# Patient Record
Sex: Male | Born: 1970 | Hispanic: No | Marital: Married
Health system: Southern US, Community
[De-identification: ages and names within clinical notes are randomized; demographics above are authoritative.]

## PROBLEM LIST (undated history)

## (undated) DIAGNOSIS — R251 Tremor, unspecified: Secondary | ICD-10-CM

## (undated) DIAGNOSIS — M542 Cervicalgia: Secondary | ICD-10-CM

## (undated) HISTORY — DX: Cervicalgia: M54.2

## (undated) HISTORY — PX: SHOULDER SURGERY: SHX246

## (undated) HISTORY — DX: Tremor, unspecified: R25.1

---

## 2015-09-20 ENCOUNTER — Encounter (HOSPITAL_COMMUNITY): Payer: Self-pay | Admitting: *Deleted

## 2015-09-20 ENCOUNTER — Emergency Department (INDEPENDENT_AMBULATORY_CARE_PROVIDER_SITE_OTHER)
Admission: EM | Admit: 2015-09-20 | Discharge: 2015-09-20 | Disposition: A | Discharge status: Home / Self Care | Payer: Medicaid Other | Source: Home / Self Care | Attending: Family Medicine | Admitting: Family Medicine

## 2015-09-20 DIAGNOSIS — M5442 Lumbago with sciatica, left side: Secondary | ICD-10-CM

## 2015-09-20 MED ORDER — METHYLPREDNISOLONE 4 MG PO TBPK: ORAL_TABLET | ORAL | Status: DC

## 2015-09-20 MED ORDER — CYCLOBENZAPRINE HCL 5 MG PO TABS: 5.0000 mg | ORAL_TABLET | Freq: Three times a day (TID) | ORAL | Status: DC | PRN

## 2015-09-20 NOTE — ED Provider Notes (Signed)
CSN: [id number]     Arrival date & time 09/20/15  1655 History   First MD Initiated Contact with Patient 09/20/15 1840     Chief Complaint  Patient presents with  . Back Pain   (Consider location/radiation/quality/duration/timing/severity/associated sxs/prior Treatment) Patient is a 45 y.o. male presenting with back pain. The history is provided by the patient. The history is limited by a language barrier. A language interpreter was used.  Back Pain Location:  Sacro-iliac joint and lumbar spine Quality:  Stiffness Radiates to:  L posterior upper leg Pain severity:  Moderate Onset quality:  Gradual Duration:  2 days Progression:  Waxing and waning Chronicity:  Chronic Associated symptoms: leg pain, numbness and paresthesias   Associated symptoms: no abdominal pain, no bladder incontinence, no dysuria, no fever, no pelvic pain, no perianal numbness and no weakness     History reviewed. No pertinent past medical history. History reviewed. No pertinent past surgical history. No family history on file. Social History  Substance Use Topics  . Smoking status: None  . Smokeless tobacco: None  . Alcohol Use: Yes    Review of Systems  Constitutional: Negative.  Negative for fever.  Cardiovascular: Negative.   Gastrointestinal: Negative.  Negative for abdominal pain.  Genitourinary: Negative.  Negative for bladder incontinence, dysuria and pelvic pain.  Musculoskeletal: Positive for back pain. Negative for joint swelling and gait problem.  Neurological: Positive for numbness and paresthesias. Negative for weakness.  All other systems reviewed and are negative.   Allergies  Review of patient's allergies indicates no known allergies.  Home Medications   Prior to Admission medications   Medication Sig Start Date End Date Taking? Authorizing Provider  cyclobenzaprine (FLEXERIL) 5 MG tablet Take 1 tablet (5 mg total) by mouth 3 (three) times daily as needed for muscle spasms. 09/20/15    Linna Hoff, MD  methylPREDNISolone (MEDROL DOSEPAK) 4 MG TBPK tablet follow package directions , start on tues. Take until finished. 09/20/15   Linna Hoff, MD   Meds Ordered and Administered this Visit  Medications - No data to display  BP 135/75 mmHg  Pulse 77  Temp(Src) 98.2 F (36.8 C) (Oral)  Resp 16  SpO2 98% No data found.   Physical Exam  Constitutional: He is oriented to person, place, and time. He appears well-developed and well-nourished. No distress.  Abdominal: Soft. Bowel sounds are normal.  Musculoskeletal: He exhibits tenderness.       Lumbar back: He exhibits decreased range of motion, tenderness, bony tenderness, pain and spasm. He exhibits no swelling, no deformity and normal pulse.       Back:  Neurological: He is alert and oriented to person, place, and time.  Skin: Skin is warm and dry.  Nursing note and vitals reviewed.   ED Course  Procedures (including critical care time)  Labs Review Labs Reviewed - No data to display  Imaging Review No results found.   Visual Acuity Review  Right Eye Distance:   Left Eye Distance:   Bilateral Distance:    Right Eye Near:   Left Eye Near:    Bilateral Near:         MDM   1. Left-sided low back pain with left-sided sciatica    Meds ordered this encounter  Medications  . cyclobenzaprine (FLEXERIL) 5 MG tablet    Sig: Take 1 tablet (5 mg total) by mouth 3 (three) times daily as needed for muscle spasms.    Dispense:  30 tablet  Refill:  0  . methylPREDNISolone (MEDROL DOSEPAK) 4 MG TBPK tablet    Sig: follow package directions , start on tues. Take until finished.    Dispense:  21 tablet    Refill:  0       Linna Hoff, MD 09/20/15 [phone]

## 2015-09-20 NOTE — ED Notes (Addendum)
Pt  Reports   Back  Pain        For  A  Long  Time     denys   Any        Recent  Injury        Has had  Problems  In  Past           Pt      Has  Been  In  [place]  For  5  Months      Pt  Is  Sitting  Upright  On the  Exam table  Speaking in   Complete  sentances      [employer]  Interpretors  utilized

## 2015-10-13 ENCOUNTER — Other Ambulatory Visit: Payer: Self-pay

## 2015-10-13 ENCOUNTER — Other Ambulatory Visit: Payer: Self-pay | Admitting: Nurse Practitioner

## 2015-10-13 ENCOUNTER — Encounter (HOSPITAL_COMMUNITY): Payer: Self-pay | Admitting: Vascular Surgery

## 2015-10-13 ENCOUNTER — Emergency Department (INDEPENDENT_AMBULATORY_CARE_PROVIDER_SITE_OTHER)
Admission: EM | Admit: 2015-10-13 | Discharge: 2015-10-13 | Disposition: A | Discharge status: Nursing Home (Includes ICF) | Payer: Medicaid Other | Source: Home / Self Care | Attending: Family Medicine | Admitting: Family Medicine

## 2015-10-13 ENCOUNTER — Emergency Department (HOSPITAL_COMMUNITY): Payer: Medicaid Other

## 2015-10-13 ENCOUNTER — Emergency Department (HOSPITAL_COMMUNITY)
Admission: EM | Admit: 2015-10-13 | Discharge: 2015-10-13 | Disposition: A | Discharge status: Home / Self Care | Payer: Medicaid Other | Attending: Emergency Medicine | Admitting: Emergency Medicine

## 2015-10-13 DIAGNOSIS — R079 Chest pain, unspecified: Secondary | ICD-10-CM | POA: Diagnosis present

## 2015-10-13 DIAGNOSIS — F17210 Nicotine dependence, cigarettes, uncomplicated: Secondary | ICD-10-CM | POA: Diagnosis not present

## 2015-10-13 DIAGNOSIS — R072 Precordial pain: Secondary | ICD-10-CM

## 2015-10-13 LAB — BASIC METABOLIC PANEL
Anion gap: 10 (ref 5–15)
BUN: 13 mg/dL (ref 6–20)
CO2: 24 mmol/L (ref 22–32)
Calcium: 9.3 mg/dL (ref 8.9–10.3)
Chloride: 107 mmol/L (ref 101–111)
Creatinine, Ser: 0.79 mg/dL (ref 0.61–1.24)
GFR calc Af Amer: >60 mL/min (ref >60)
GFR calc non Af Amer: >60 mL/min (ref >60)
Glucose, Bld: 116 mg/dL — ABNORMAL HIGH (ref 65–99)
Potassium: 3.4 mmol/L — ABNORMAL LOW (ref 3.5–5.1)
Sodium: 141 mmol/L (ref 135–145)

## 2015-10-13 LAB — CBC
HCT: 43.4 % (ref 39.0–52.0)
Hemoglobin: 14.9 g/dL (ref 13.0–17.0)
MCH: 30.3 pg (ref 26.0–34.0)
MCHC: 34.3 g/dL (ref 30.0–36.0)
MCV: 88.2 fL (ref 78.0–100.0)
Platelets: 254 10*3/uL (ref 150–400)
RBC: 4.92 MIL/uL (ref 4.22–5.81)
RDW: 12.3 % (ref 11.5–15.5)
WBC: 9.5 10*3/uL (ref 4.0–10.5)

## 2015-10-13 LAB — I-STAT TROPONIN, ED
Troponin i, poc: 0 ng/mL (ref 0.00–0.08)
Troponin i, poc: 0 ng/mL (ref 0.00–0.08)

## 2015-10-13 MED ORDER — NITROGLYCERIN 0.4 MG SL SUBL
0.4000 mg | SUBLINGUAL_TABLET | SUBLINGUAL | Status: DC | PRN
  Administered 2015-10-13: 0.4 mg via SUBLINGUAL

## 2015-10-13 MED ORDER — ASPIRIN 81 MG PO CHEW
CHEWABLE_TABLET | ORAL | Status: AC
  Filled 2015-10-13: qty 4

## 2015-10-13 MED ORDER — NITROGLYCERIN 0.4 MG SL SUBL
SUBLINGUAL_TABLET | SUBLINGUAL | Status: AC
  Filled 2015-10-13: qty 1

## 2015-10-13 MED ORDER — ASPIRIN 81 MG PO CHEW
324.0000 mg | CHEWABLE_TABLET | Freq: Once | ORAL | Status: AC
Start: 2015-10-13 — End: 2015-10-13
  Administered 2015-10-13: 324 mg via ORAL

## 2015-10-13 NOTE — ED Notes (Signed)
Pt reports to the ED for eval of left sided CP that radiates into his left arm and elbow. Reports associated symptoms of SOB. Pain began around 7 am today while at work. Pain is worse with leaning forward. Received 1 nitro and 324 of ASA and now pain is much better. Only a 1/10. No significant medical hx. 12 lead unremarkable. Pt A&OX4, resp e/u, and skin warm and dry. Speaks Arabic, interpretor phone used for assessment.

## 2015-10-13 NOTE — ED Provider Notes (Signed)
CSN: [id number]     Arrival date & time 10/13/15  1442 History   First MD Initiated Contact with Patient 10/13/15 1505     Chief Complaint  Patient presents with  . Chest Pain     (Consider location/radiation/quality/duration/timing/severity/associated sxs/prior Treatment) Patient is a 45 y.o. male presenting with chest pain. The history is provided by the patient and medical records. A language interpreter was used.  Chest Pain Associated symptoms: no abdominal pain, no back pain, no cough, no dizziness, no fever, no headache, no nausea, no palpitations, no shortness of breath, not vomiting and no weakness    Arthur James is a 45 y.o. male  with no pertinent PMH who was sent to the emergency department from urgent care for central, left-sided chest pain described as sharp and tight which radiated down the left shoulder and arm. Onset 7 AM. Associated symptoms include shortness of breath. At urgent care patient was given 324 ASA and nitroglycerin. Patient states improvement after medications were given. At the time of my initial evaluation, chest pain had resolved. No cardiac history. No cardiac family history. Every day smoker. Denies drug use.   History reviewed. No pertinent past medical history. History reviewed. No pertinent past surgical history. No family history on file. Social History  Substance Use Topics  . Smoking status: Current Every Day Smoker -- 1.00 packs/day    Types: Cigarettes  . Smokeless tobacco: Never Used  . Alcohol Use: No    Review of Systems  Constitutional: Negative for fever and chills.  HENT: Negative for congestion and sore throat.   Eyes: Negative for visual disturbance.  Respiratory: Negative for cough, shortness of breath and wheezing.   Cardiovascular: Positive for chest pain. Negative for palpitations.  Gastrointestinal: Negative for nausea, vomiting and abdominal pain.  Genitourinary: Negative for dysuria.  Musculoskeletal: Negative for back  pain and neck pain.  Skin: Negative for color change.  Neurological: Negative for dizziness, weakness and headaches.      Allergies  Review of patient's allergies indicates no known allergies.  Home Medications   Prior to Admission medications   Medication Sig Start Date End Date Taking? Authorizing Provider  cyclobenzaprine (FLEXERIL) 5 MG tablet Take 1 tablet (5 mg total) by mouth 3 (three) times daily as needed for muscle spasms. Patient not taking: Reported on 10/13/2015 09/20/15   Linna HoffJames D Kindl, MD  methylPREDNISolone (MEDROL DOSEPAK) 4 MG TBPK tablet follow package directions , start on tues. Take until finished. Patient not taking: Reported on 10/13/2015 09/20/15   Linna HoffJames D Kindl, MD   BP 97/69 mmHg  Pulse 70  Temp(Src) 98.4 F (36.9 C) (Oral)  Resp 21  SpO2 96% Physical Exam  Constitutional: He is oriented to person, place, and time. He appears well-developed and well-nourished.  Alert and in no acute distress  HENT:  Head: Normocephalic and atraumatic.  Neck:  No carotid bruits.  Cardiovascular: Normal rate, regular rhythm, normal heart sounds and intact distal pulses.  Exam reveals no gallop and no friction rub.   No murmur heard. Pulmonary/Chest: Effort normal and breath sounds normal. No respiratory distress. He has no wheezes. He has no rales. He exhibits no tenderness.  Abdominal: Soft. He exhibits no distension and no mass. There is no tenderness. There is no rebound and no guarding.  Musculoskeletal: He exhibits no edema.  Neurological: He is alert and oriented to person, place, and time.  Skin: Skin is warm and dry. No rash noted.  Psychiatric: He has a normal  mood and affect. His behavior is normal. Judgment and thought content normal.  Nursing note and vitals reviewed.   ED Course  Procedures (including critical care time) Labs Review Labs Reviewed  BASIC METABOLIC PANEL - Abnormal; Notable for the following:    Potassium 3.4 (*)    Glucose, Bld 116 (*)     All other components within normal limits  CBC  I-STAT TROPOININ, ED  I-STAT TROPOININ, ED    Imaging Review Dg Chest 2 View  10/13/2015  CLINICAL DATA:  Left-sided chest pain radiating into the left arm and elbow beginning this morning. Initial encounter. EXAM: CHEST  2 VIEW COMPARISON:  None. FINDINGS: The lungs are clear. Heart size is normal. No pneumothorax or pleural effusion. No focal bony abnormality. IMPRESSION: Negative chest. Electronically Signed   By: Drusilla Kanner M.D.   On: 10/13/2015 15:13   I have personally reviewed and evaluated these images and lab results as part of my medical decision-making.   EKG Interpretation   Date/Time:  Wednesday October 13 2015 14:49:20 EDT Ventricular Rate:  92 PR Interval:  140 QRS Duration: 96 QT Interval:  361 QTC Calculation: 447 R Axis:   53 Text Interpretation:  Sinus rhythm Confirmed by Lincoln Brigham [id number]) on  10/13/2015 2:55:23 PM      MDM   Final diagnoses:  Chest pain, unspecified chest pain type   Arthur James presents from urgent care for left-sided chest pain which radiates down left area. He was given ASA and nitro at urgent care and chest pain improved after nitro. All labs reviewed and are reassuring. CXR with no acute abnormalities. Cardiology was consulted who recommends delta trop and outpatient follow up. They will call to schedule appointment. Information will also be provided on discharge instructions.   2nd troponin negative. Patient re-evaluated prior to discharge, and states chest pain has not returned.  Patient is to be discharged with recommendation to follow up with cardiology in regards to today's hospital visit. Labs and imaging reviewed again prior to dc. CXR and ECG with no acute abnormalities and neg troponins x2. Pt has been advised return to the ED if develop any exertional chest pain- strict return precautions discussed and all questions answered. Follow up care again discussed. All questions  answered.   Patient discussed with Dr. Madilyn Hook who agrees with treatment plan.   [facility] Genelle Economou, PA-C 10/13/15 1826  Tilden Fossa, MD 10/14/15 [phone]

## 2015-10-13 NOTE — ED Notes (Signed)
Pt is suffering from left sided chest pain that radiates to his left shoulder and down to his left elbow.  Pt reports

## 2015-10-13 NOTE — ED Provider Notes (Signed)
CSN: [id number]     Arrival date & time 10/13/15  1329 History   First MD Initiated Contact with Patient 10/13/15 1340     No chief complaint on file.  (Consider location/radiation/quality/duration/timing/severity/associated sxs/prior Treatment) HPIhistory from patient through interpreter; Onset of chest pain, pressure, tightness around 7 am this morning.  Symptoms continue at this time. There is radiation to left shoulder and arm. Also SOB No history of cardiac problems in the past.  No past medical history on file. No past surgical history on file. No family history on file. Social History  Substance Use Topics  . Smoking status: Not on file  . Smokeless tobacco: Not on file  . Alcohol Use: Yes    Review of Systems Chest pain Allergies  Review of patient's allergies indicates no known allergies.  Home Medications   Prior to Admission medications   Medication Sig Start Date End Date Taking? Authorizing Provider  cyclobenzaprine (FLEXERIL) 5 MG tablet Take 1 tablet (5 mg total) by mouth 3 (three) times daily as needed for muscle spasms. 09/20/15   Linna HoffJames D Kindl, MD  methylPREDNISolone (MEDROL DOSEPAK) 4 MG TBPK tablet follow package directions , start on tues. Take until finished. 09/20/15   Linna HoffJames D Kindl, MD   Meds Ordered and Administered this Visit   Medications  aspirin chewable tablet 324 mg (not administered)  nitroGLYCERIN (NITROSTAT) SL tablet 0.4 mg (not administered)    BP 107/75 mmHg  Pulse 101  Temp(Src) 98.3 F (36.8 C) (Oral)  Resp 16  SpO2 97% No data found.   Physical Exam NURSES NOTES AND VITAL SIGNS REVIEWED. CONSTITUTIONAL: Well developed, well nourished, no acute distress HEENT: normocephalic, atraumatic, right and left TM's are normal EYES: Conjunctiva normal NECK:normal ROM, supple, no adenopathy PULMONARY:No respiratory distress, normal effort, Lungs: CTAb/l, no wheezes, or increased work of breathing CARDIOVASCULAR: RRR, no  murmur ABDOMEN: soft, ND, NT, +'ve BS MUSCULOSKELETAL: Normal ROM of all extremities,  SKIN: warm and dry without rash PSYCHIATRIC: Mood and affect, behavior are normal  ED Course  Procedures (including critical care time)  Labs Review Labs Reviewed - No data to display  Imaging Review No results found.   Visual Acuity Review  Right Eye Distance:   Left Eye Distance:   Bilateral Distance:    Right Eye Near:   Left Eye Near:    Bilateral Near:      EKG: normal EKG, normal sinus rhythm.    MDM   1. Chest pain, unspecified chest pain type     Pt is referred to the ED for definitive treatment and evaluation of chest pain.     Tharon AquasFrank C Hasheem Voland, PA 10/13/15 [phone]

## 2015-10-13 NOTE — Discharge Instructions (Signed)
1. Medications: continue usual home medications 2. Treatment: rest, drink plenty of fluids 3. Follow Up: Please call the cardiologist listed for an appointment in 1-2 weeks. Please return to the ER for chest pain on exertion, shortness of breath, new or worsening symptoms, any additional concerns.

## 2015-10-22 ENCOUNTER — Encounter: Payer: Self-pay | Admitting: *Deleted

## 2016-09-27 ENCOUNTER — Ambulatory Visit (INDEPENDENT_AMBULATORY_CARE_PROVIDER_SITE_OTHER): Payer: Worker's Compensation | Admitting: Orthopedic Surgery

## 2016-09-27 ENCOUNTER — Encounter (INDEPENDENT_AMBULATORY_CARE_PROVIDER_SITE_OTHER): Payer: Self-pay | Admitting: Orthopedic Surgery

## 2016-09-27 DIAGNOSIS — S46812A Strain of other muscles, fascia and tendons at shoulder and upper arm level, left arm, initial encounter: Secondary | ICD-10-CM

## 2016-09-27 DIAGNOSIS — IMO0001 Reserved for inherently not codable concepts without codable children: Secondary | ICD-10-CM

## 2016-09-27 HISTORY — DX: Reserved for inherently not codable concepts without codable children: IMO0001

## 2016-09-27 MED ORDER — TRAMADOL HCL 50 MG PO TABS: 50.0000 mg | ORAL_TABLET | Freq: Four times a day (QID) | ORAL | 0 refills | Status: DC | PRN

## 2016-09-27 NOTE — Progress Notes (Signed)
Office Visit Note   Patient: Arthur James           Date of Birth: 1970-09-14           MRN: [id number] Visit Date: 09/27/2016 Requested by: No referring provider defined for this encounter. PCP: No primary care provider on file.  Subjective: Chief Complaint  Patient presents with  . 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  He is at the cast put on the shelf and the capsule backwards.  He is unable to raise his arm and describes a lot of pain radiating into the biceps area.  He has had an MRI scan which is reviewed.  It is reviewed with the patient and his translator/partner.  The scan shows before meals joint arthropathy along with a tear of the supraspinatus with 2 cm of retraction.  Tried hydrocodone methocarbamol Aleve and Tylenol arthritis without relief.  He is a smoker but he doesn't drink.  He has not been to work since his injury.  He does have to lift up to 200 pounds at work according to his Nurse, learning disabilitytranslator.  He denies any other orthopedic complaints.              Review of Systems All systems reviewed are negative as they relate to the chief complaint within the history of present illness.  Patient denies  fevers or chills.    Assessment & Plan: Visit Diagnoses:  1. Supraspinatus tendon tear, left, initial encounter     Plan: Impression is right shoulder rotator cuff tear supraspinatus with symptomatic acromioclavicular joint arthropathy.  Plan is surgical intervention with arthroscopy evaluation of the biceps tendon arthroscopic distal clavicle excision with mini open rotator cuff tear repair of the supraspinatus.  Risks and benefits discussed with the patient could not limited to infection or vessel damage potential for residual pain as well as limited shoulder range of motion.  The patient is a smoker which does complicate healing.  That will adversely affect  the rotator cuff ability to heal.  I'm going to prescribe him tramadol for pain.  He will need a shoulder CPM machine for 4 weeks post surgery.  We will keep him in the CPM machine for the first 2 weeks after surgery then initiate physical therapy in the third week.  Anticipate total out of work time for the type of lifting he was doing to be at a minimum 3 months but likely closer to 4.  Follow-Up Instructions: No Follow-up on file.   Orders:  No orders of the defined types were placed in this encounter.  Meds ordered this encounter  Medications  . traMADol (ULTRAM) 50 MG tablet    Sig: Take 1 tablet (50 mg total) by mouth every 6 (six) hours as needed.    Dispense:  40 tablet    Refill:  0      Procedures: No procedures performed   Clinical Data: No additional findings.  Objective: Vital Signs: There were no vitals taken for this visit.  Physical Exam   Constitutional: Patient appears well-developed HEENT:  Head: Normocephalic Eyes:EOM are normal Neck: Normal range of motion Cardiovascular: Normal rate Pulmonary/chest: Effort normal Neurologic: Patient is alert Skin: Skin is warm Psychiatric: Patient has normal mood and affect    Ortho Exam orthopedic exam demonstrates good cervical spine range of motion.  5 out of 5 grip  EPL FPL interosseous wrist flexion-extension biceps triceps and deltoid strength.  Patient has weakness to supraspinatus testing on the right compared to the left but does have maintenance of passive range of motion with external rotation at 15 of abduction to about 50 bilaterally.  He does have acromioclavicular joint tenderness to direct palpation on the right and on the left.  There is pain with crossarm adduction and on the right and not the left.  No other masses lymph adenopathy or skin changes noted in the right shoulder girdle region  Specialty Comments:  No specialty comments available.  Imaging: No results found.   PMFS  History: Patient Active Problem List   Diagnosis Date Noted  . Supraspinatus tendon tear, left, initial encounter 09/27/2016   No past medical history on file.  No family history on file.  No past surgical history on file. Social History   Occupational History  . Not on file.   Social History Main Topics  . Smoking status: Current Every Day Smoker    Packs/day: 1.00    Types: Cigarettes  . Smokeless tobacco: Never Used  . Alcohol use No  . Drug use: No  . Sexual activity: Not on file

## 2016-09-28 ENCOUNTER — Telehealth (INDEPENDENT_AMBULATORY_CARE_PROVIDER_SITE_OTHER): Payer: Self-pay | Admitting: Orthopedic Surgery

## 2016-09-28 NOTE — Telephone Encounter (Signed)
Patients wife called saying that the tramadol that was prescribed to her husband isn't working and he's still in a lot of pain. Could he be prescribed anything stronger? CB # [phone]

## 2016-09-28 NOTE — Telephone Encounter (Signed)
Rx request, stronger med, pls advise

## 2016-09-29 ENCOUNTER — Other Ambulatory Visit (INDEPENDENT_AMBULATORY_CARE_PROVIDER_SITE_OTHER): Payer: Self-pay

## 2016-09-29 ENCOUNTER — Telehealth (INDEPENDENT_AMBULATORY_CARE_PROVIDER_SITE_OTHER): Payer: Self-pay | Admitting: Orthopedic Surgery

## 2016-09-29 MED ORDER — ACETAMINOPHEN-CODEINE #3 300-30 MG PO TABS: 1.0000 | ORAL_TABLET | Freq: Three times a day (TID) | ORAL | 0 refills | Status: DC | PRN

## 2016-09-29 NOTE — Progress Notes (Unsigned)
tylenol

## 2016-09-29 NOTE — Telephone Encounter (Signed)
Okay for Tylenol 3 one by mouth every 8 hours when necessary pain #30 please call thank you

## 2016-09-29 NOTE — Telephone Encounter (Signed)
Patient aware this has been called into pharmacy

## 2016-09-29 NOTE — Telephone Encounter (Signed)
Patient's sister called advised the medicine her brother is taking is not working. She asked if he can get something stronger. The number to contact her is [phone]

## 2016-10-02 ENCOUNTER — Telehealth (INDEPENDENT_AMBULATORY_CARE_PROVIDER_SITE_OTHER): Payer: Self-pay | Admitting: Orthopedic Surgery

## 2016-10-02 MED ORDER — ACETAMINOPHEN-CODEINE #3 300-30 MG PO TABS: 1.0000 | ORAL_TABLET | Freq: Four times a day (QID) | ORAL | 0 refills | Status: DC | PRN

## 2016-10-02 NOTE — Telephone Encounter (Signed)
Ok for t 3 1 po q 6 # 40 pls cla lhtx nothing stronger pre op

## 2016-10-02 NOTE — Telephone Encounter (Signed)
Received VM from Anne Hartnett, [facility]. Will call to schedule pt for surgery. Her CB# is [phone] ext: [phone]

## 2016-10-02 NOTE — Addendum Note (Signed)
Addended by: Rogers SeedsYEATTS, Piotr Christopher M on: 10/02/2016 05:29 PM   Modules accepted: Orders

## 2016-10-02 NOTE — Telephone Encounter (Signed)
Tylenol #3 called to pharmacy. I called patient's sister and advised.

## 2016-10-02 NOTE — Telephone Encounter (Signed)
Rx tramadol not helping with pain, surgery now approved and scheduled 10/16/16, plesae advise on meds?

## 2016-10-05 ENCOUNTER — Telehealth (INDEPENDENT_AMBULATORY_CARE_PROVIDER_SITE_OTHER): Payer: Self-pay | Admitting: *Deleted

## 2016-10-05 NOTE — Telephone Encounter (Signed)
Patient will have stitches will have an incision will undergo rotator cuff repair will have a CPM machine after surgery.  All these things were discussed at last clinic visit.  Please reinforce them again thanks

## 2016-10-05 NOTE — Telephone Encounter (Signed)
Pt family member came in, pt is asking what will be happening during surgery, stitches? And other details. Pt doesn't speak english so family member left her number to translate for pt [phone]

## 2016-10-05 NOTE — Telephone Encounter (Signed)
Please advise 

## 2016-10-06 NOTE — Telephone Encounter (Signed)
Called and made ZalmaBrent from [employer] aware.

## 2016-10-06 NOTE — Telephone Encounter (Signed)
Lyn HenriVehan, family member, will need to be the one that [employer] speaks with, will you pass his number along?  [phone]-

## 2016-10-16 DIAGNOSIS — M75121 Complete rotator cuff tear or rupture of right shoulder, not specified as traumatic: Secondary | ICD-10-CM | POA: Diagnosis not present

## 2016-10-16 DIAGNOSIS — M19011 Primary osteoarthritis, right shoulder: Secondary | ICD-10-CM | POA: Diagnosis not present

## 2016-10-16 DIAGNOSIS — M7541 Impingement syndrome of right shoulder: Secondary | ICD-10-CM | POA: Diagnosis not present

## 2016-10-17 ENCOUNTER — Telehealth (INDEPENDENT_AMBULATORY_CARE_PROVIDER_SITE_OTHER): Payer: Self-pay

## 2016-10-17 ENCOUNTER — Telehealth (INDEPENDENT_AMBULATORY_CARE_PROVIDER_SITE_OTHER): Payer: Self-pay | Admitting: Orthopedic Surgery

## 2016-10-17 NOTE — Telephone Encounter (Signed)
Patient's social worker/interpreter called stating that the patient is having sharp pain in his hand since last night.  The pain is not working at all.  Patient is unable to do the machine for his hand due to the pain.  CB#[phone].  Thank you.

## 2016-10-17 NOTE — Telephone Encounter (Signed)
See other note from today for further documentation 

## 2016-10-17 NOTE — Telephone Encounter (Signed)
Patient calling again stating she would like a call back as soon as possible. States patient is in a lot of pain and would like something stronger for pain

## 2016-10-17 NOTE — Telephone Encounter (Signed)
Called and spoke with patient via interpreter.  Patient is not running a fever, no shortness of breath, no radicular arm pain.  Patient reports that he wants something stronger for pain other than the oxycodone that he has been taking. I explained that this was very strong narcotic and not likely that he would get stronger meds.  I explained needed to be using sling, icing etc. Also advised that he just had surgery yesterday and should expect having pain and discomfort.   Told them that he needed to be working his fingers and supplementing with ibuprofen. Advised if not better in the morning to call us back and we would have to get patient worked in.

## 2016-10-23 ENCOUNTER — Inpatient Hospital Stay (INDEPENDENT_AMBULATORY_CARE_PROVIDER_SITE_OTHER): Payer: Worker's Compensation | Admitting: Orthopedic Surgery

## 2016-10-25 ENCOUNTER — Ambulatory Visit (INDEPENDENT_AMBULATORY_CARE_PROVIDER_SITE_OTHER): Payer: Worker's Compensation | Admitting: Orthopedic Surgery

## 2016-10-25 ENCOUNTER — Encounter (INDEPENDENT_AMBULATORY_CARE_PROVIDER_SITE_OTHER): Payer: Self-pay | Admitting: Orthopedic Surgery

## 2016-10-25 DIAGNOSIS — Z9889 Other specified postprocedural states: Secondary | ICD-10-CM

## 2016-10-25 MED ORDER — OXYCODONE HCL 5 MG PO TABS: 10.0000 mg | ORAL_TABLET | ORAL | 0 refills | Status: DC | PRN

## 2016-10-25 MED ORDER — METHOCARBAMOL 500 MG PO TABS: 500.0000 mg | ORAL_TABLET | Freq: Three times a day (TID) | ORAL | 0 refills | Status: DC

## 2016-10-25 MED ORDER — IBUPROFEN-FAMOTIDINE 800-26.6 MG PO TABS: 1.0000 | ORAL_TABLET | Freq: Two times a day (BID) | ORAL | 0 refills | Status: DC

## 2016-10-25 NOTE — Progress Notes (Signed)
   Post-Op Visit Note   Patient: Arthur James           Date of Birth: 03-20-1971           MRN: [id number] Visit Date: 10/25/2016 PCP: No PCP Per Patient   Assessment & Plan:  Chief Complaint:  Chief Complaint  Patient presents with  . Right Shoulder - Routine Post Op   Visit Diagnoses:  1. S/P right rotator cuff repair     Plan: Patient is a 46 year old now a week out right shoulder rotator cuff repair EC joint distal clavicle resection arthroscopically.  This been reporting some pain since surgery.  Is not sleeping well.  He is on oxycodone.  On exam his shoulder is stiff.  I'm going to discontinue his sling use no lifting with the right arm portal sutures removed.  He is having some numbness in the thumb region I examined that and his EPL FPL interosseous wrist flexion and wrist extension strength is intact.  Deltoid does fire.  Biceps and triceps a little weak.  Had to axis and refill oxycodone.  Also begin physical therapy 3 times a week for 4 weeks.  Continue with CPM machine but increase it to 4 and half hours a day.  I do want him to have less stiffness in the shoulder.  I'll see him back in 2 weeks for clinical recheck.  I think some of the numbness in the thumb may be related to the block in that something will have to watch.  Motor function to the hand is intact.  Follow-Up Instructions: Return in about 2 weeks (around 11/08/2016).   Orders:  No orders of the defined types were placed in this encounter.  No orders of the defined types were placed in this encounter.   Imaging: No results found.  PMFS History: Patient Active Problem List   Diagnosis Date Noted  . Supraspinatus tendon tear, left, initial encounter 09/27/2016   No past medical history on file.  No family history on file.  No past surgical history on file. Social History   Occupational History  . Not on file.   Social History Main Topics  . Smoking status: Current Every Day Smoker    Packs/day:  1.00    Types: Cigarettes  . Smokeless tobacco: Never Used  . Alcohol use No  . Drug use: No  . Sexual activity: Not on file

## 2016-11-09 ENCOUNTER — Ambulatory Visit (INDEPENDENT_AMBULATORY_CARE_PROVIDER_SITE_OTHER): Payer: Worker's Compensation | Admitting: Orthopedic Surgery

## 2016-11-09 ENCOUNTER — Encounter (INDEPENDENT_AMBULATORY_CARE_PROVIDER_SITE_OTHER): Payer: Self-pay | Admitting: Orthopedic Surgery

## 2016-11-09 DIAGNOSIS — Z9889 Other specified postprocedural states: Secondary | ICD-10-CM

## 2016-11-10 NOTE — Progress Notes (Signed)
   Post-Op Visit Note   Patient: Arthur James           Date of Birth: 1970-09-26           MRN: [id number] Visit Date: 11/09/2016 PCP: No PCP Per Patient   Assessment & Plan:  Chief Complaint:  Chief Complaint  Patient presents with  . Right Shoulder - Routine Post Op   Visit Diagnoses:  1. S/P rotator cuff repair     Plan: Mom is a 46 year old patient who is now 3 weeks out right shoulder arthroscopy rotator cuff repair and distal clavicle excision.  Reporting pain in the arm.  His leg is not getting too much better.  He is using the CPM about 1 hour 3 times a day.  Taking oxycodone for pain but only about 2 tablets a day.  Reports a little bit of burning in the thumb.  That may be related to the block.  On exam his deltoid fires.  Biceps and triceps fire.  He has generally a lot of pain with range of motion of the shoulder.  There is no skin color difference or temperature difference in the right arm versus left arm.  Plan at this time is to add physical therapy to the CPM machine regimen.  We really need to get this shoulder limbered up.  If not he is probably looking at manipulation in 3 weeks.  I'll see him back for decision about that in 3 weeks.  Follow-Up Instructions: Return in about 3 weeks (around 11/30/2016).   Orders:  No orders of the defined types were placed in this encounter.  No orders of the defined types were placed in this encounter.   Imaging: No results found.  PMFS History: Patient Active Problem List   Diagnosis Date Noted  . Supraspinatus tendon tear, left, initial encounter 09/27/2016   No past medical history on file.  No family history on file.  No past surgical history on file. Social History   Occupational History  . Not on file.   Social History Main Topics  . Smoking status: Current Every Day Smoker    Packs/day: 1.00    Types: Cigarettes  . Smokeless tobacco: Never Used  . Alcohol use No  . Drug use: No  . Sexual activity: Not on  file

## 2016-11-30 ENCOUNTER — Encounter (INDEPENDENT_AMBULATORY_CARE_PROVIDER_SITE_OTHER): Payer: Self-pay | Admitting: Orthopedic Surgery

## 2016-11-30 ENCOUNTER — Ambulatory Visit (INDEPENDENT_AMBULATORY_CARE_PROVIDER_SITE_OTHER): Payer: Worker's Compensation | Admitting: Orthopedic Surgery

## 2016-11-30 DIAGNOSIS — Z9889 Other specified postprocedural states: Secondary | ICD-10-CM

## 2016-11-30 NOTE — Progress Notes (Signed)
   Post-Op Visit Note   Patient: Arthur James           Date of Birth: 1971/07/16           MRN: [id number] Visit Date: 11/30/2016 PCP: No PCP Per Patient   Assessment & Plan:  Chief Complaint:  Chief Complaint  Patient presents with  . Right Shoulder - Follow-up, Routine Post Op   Visit Diagnoses:  1. S/P rotator cuff repair     Plan: [name] is a patient who is now 6 weeks out rotator cuff repair.  He's having some issues the stiffness.  On exam he is actually a little better this week than he was prior clinic visit.  Still has weakness with forward flexion and abduction.  On exam he does have more flexibility and he has good grip EPL FPL interosseous strength on the right-hand side.  This skin color difference or temperature difference right shoulder versus left.  AGAINST MEDICAL ADVICE he is maintained use of the sling which I have encouraged him not to use any more.  At this time I want him to start with strengthening exercises.  He is 6 weeks out.  Decision point today was for against manipulation under anesthesia.  I think he is making some gains with range of motion although not nearly as much as I would have hoped but he is improving.  We will hold off on an ambulation for now and see if the addition of strengthening can help him recover faster.  Two-week return for clinical recheck at that time and I do want him to continue with the CPM machine.  Follow-Up Instructions: Return in about 2 weeks (around 12/14/2016).   Orders:  No orders of the defined types were placed in this encounter.  No orders of the defined types were placed in this encounter.   Imaging: No results found.  PMFS History: Patient Active Problem List   Diagnosis Date Noted  . Supraspinatus tendon tear, left, initial encounter 09/27/2016   No past medical history on file.  No family history on file.  No past surgical history on file. Social History   Occupational History  . Not on file.   Social  History Main Topics  . Smoking status: Current Every Day Smoker    Packs/day: 1.00    Types: Cigarettes  . Smokeless tobacco: Never Used  . Alcohol use No  . Drug use: No  . Sexual activity: Not on file

## 2016-12-12 ENCOUNTER — Telehealth (INDEPENDENT_AMBULATORY_CARE_PROVIDER_SITE_OTHER): Payer: Self-pay

## 2016-12-12 NOTE — Telephone Encounter (Signed)
Faxed the 11/09/16 and 11/30/16 office notes to [facility] adj per her request

## 2016-12-15 ENCOUNTER — Ambulatory Visit (INDEPENDENT_AMBULATORY_CARE_PROVIDER_SITE_OTHER): Payer: Worker's Compensation | Admitting: Orthopedic Surgery

## 2016-12-15 ENCOUNTER — Encounter (INDEPENDENT_AMBULATORY_CARE_PROVIDER_SITE_OTHER): Payer: Self-pay | Admitting: Orthopedic Surgery

## 2016-12-15 DIAGNOSIS — M25511 Pain in right shoulder: Secondary | ICD-10-CM

## 2016-12-15 NOTE — Progress Notes (Signed)
   Post-Op Visit Note   Patient: Fredrik RiggerMohamad Vicuna           Date of Birth: [birth year]           MRN: [id number] Visit Date: 12/15/2016 PCP: Patient, No Pcp Per   Assessment & Plan:  Chief Complaint:  Chief Complaint  Patient presents with  . Right Shoulder - Follow-up, Routine Post Op   Visit Diagnoses:  1. Right shoulder pain, unspecified chronicity     Plan: Gwenyth BouillonMohammad is a 46 year old patient who is now over 2 months out right shoulder rotator cuff repair and distal clavicle excision.  Still is having difficulty achieving range of motion.  On exam his rotator cuff strength feels reasonable.  Deltoid fires.  He still has pretty significant limitation with external rotation at 15 of abduction and only about 15 on the right compared to 60 on the left.  Patient also has not yet achieved forward flexion or abduction above 90.  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  I think if we can on free the shoulder that would allow more motion and him to get more strength gains.  I'll see him back 7 days after surgery.  I do want use a CPM machine at least 6 hours a day after surgery along with physical therapy daily 5 days a week for the first 2 weeks to make sure that the gains we make with manipulation and rotator interval release are maintained.  Follow-Up Instructions: No Follow-up on file.   Orders:  No orders of the defined types were placed in this encounter.  No orders of the defined types were placed in this encounter.   Imaging: No results found.  PMFS History: Patient Active Problem List   Diagnosis Date Noted  . Supraspinatus tendon tear, left, initial encounter 09/27/2016   No past medical history on file.  No family history on file.  No past surgical history on file. Social History   Occupational History  . Not on  file.   Social History Main Topics  . Smoking status: Current Every Day Smoker    Packs/day: 1.00    Types: Cigarettes  . Smokeless tobacco: Never Used  . Alcohol use No  . Drug use: No  . Sexual activity: Not on file

## 2017-01-01 ENCOUNTER — Other Ambulatory Visit (INDEPENDENT_AMBULATORY_CARE_PROVIDER_SITE_OTHER): Payer: Self-pay

## 2017-01-01 ENCOUNTER — Encounter (INDEPENDENT_AMBULATORY_CARE_PROVIDER_SITE_OTHER): Payer: Self-pay | Admitting: Orthopedic Surgery

## 2017-01-01 DIAGNOSIS — M7501 Adhesive capsulitis of right shoulder: Secondary | ICD-10-CM | POA: Diagnosis not present

## 2017-01-01 DIAGNOSIS — G8918 Other acute postprocedural pain: Secondary | ICD-10-CM

## 2017-01-01 NOTE — Progress Notes (Unsigned)
This patient is w/c and will need auth for P.T. Dr August Saucerean wants patient to begin tomorrow. He had surgery today. Will need daily for the next 2 weeks post op

## 2017-01-01 NOTE — Progress Notes (Signed)
Emailed referral to [facility] Carenne Hartnett @ [employer]. And asked her to advise of confirmation of when pt is scheduled for.

## 2017-01-01 NOTE — Progress Notes (Signed)
PT approved by adj and sent to [employer] for scheduling.

## 2017-01-02 ENCOUNTER — Telehealth (INDEPENDENT_AMBULATORY_CARE_PROVIDER_SITE_OTHER): Payer: Self-pay

## 2017-01-02 NOTE — Telephone Encounter (Signed)
[facility]

## 2017-01-02 NOTE — Telephone Encounter (Signed)
[facility] called asking for notes/Op Report to be sent to them for this patient  Fax # [phone]

## 2017-01-05 ENCOUNTER — Telehealth (INDEPENDENT_AMBULATORY_CARE_PROVIDER_SITE_OTHER): Payer: Self-pay | Admitting: Radiology

## 2017-01-05 ENCOUNTER — Telehealth (INDEPENDENT_AMBULATORY_CARE_PROVIDER_SITE_OTHER): Payer: Self-pay

## 2017-01-05 NOTE — Telephone Encounter (Signed)
Per Dr August Saucerean we need to move postop appointment from Monday 01/08/17 to Wednesday 01/10/17.  LMVM for patient to call and do this.

## 2017-01-08 ENCOUNTER — Inpatient Hospital Stay (INDEPENDENT_AMBULATORY_CARE_PROVIDER_SITE_OTHER): Payer: Worker's Compensation | Admitting: Orthopedic Surgery

## 2017-01-08 NOTE — Telephone Encounter (Signed)
Moved patients appt from Monday to Wednesday

## 2017-01-10 ENCOUNTER — Encounter (INDEPENDENT_AMBULATORY_CARE_PROVIDER_SITE_OTHER): Payer: Self-pay | Admitting: Orthopedic Surgery

## 2017-01-10 ENCOUNTER — Ambulatory Visit (INDEPENDENT_AMBULATORY_CARE_PROVIDER_SITE_OTHER): Payer: Worker's Compensation | Admitting: Orthopedic Surgery

## 2017-01-10 DIAGNOSIS — S46811D Strain of other muscles, fascia and tendons at shoulder and upper arm level, right arm, subsequent encounter: Secondary | ICD-10-CM

## 2017-01-10 DIAGNOSIS — M79601 Pain in right arm: Secondary | ICD-10-CM

## 2017-01-10 MED ORDER — TRAMADOL HCL 50 MG PO TABS: 50.0000 mg | ORAL_TABLET | Freq: Three times a day (TID) | ORAL | 0 refills | Status: DC | PRN

## 2017-01-13 NOTE — Progress Notes (Signed)
   Post-Op Visit Note   Patient: Arthur James           Date of Birth: 1971/03/25           MRN: [id number] Visit Date: 01/10/2017 PCP: Patient, No Pcp Per   Assessment & Plan:  Chief Complaint:  Chief Complaint  Patient presents with  . Right Shoulder - Routine Post Op   Visit Diagnoses:  1. Traumatic tear of supraspinatus tendon of right shoulder, subsequent encounter   2. Right arm pain     Plan: Patient's 46 year old who is 1 week out from right shoulder manipulation under anesthesia and rotator interval release.  Rotator cuff repair was intact at the time of arthroscopy.  We did achieve some gains in range of motion with arthroscopy that he did have thickened capsule.  He elected not to use CPM machine postop.  He has essentially returned to his pre-manipulation status of limited range of motion unfortunately.  His motor and sensory function is intact with 5 out of 5 grip EPL FPL interosseous wrist flexion and wrist extension biceps and triceps strength.  His biceps is potentially slightly weaker on the right compared to the left.  Deltoid does fire.  Has limited forward flexion strength.  Plan is to refill all TRAM and send him for nerve conduction study.  I don't think there is anything really more to be done for the shoulder.  He may have had some type of nerve injury which is limiting his strength.  I think the nerve study will help us to figure out if there is anything more at play here other than a frozen shoulder.  I'll see him back after that study  Follow-Up Instructions: No Follow-up on file.   Orders:  Orders Placed This Encounter  Procedures  . Ambulatory referral to Physical Medicine Rehab   Meds ordered this encounter  Medications  . traMADol (ULTRAM) 50 MG tablet    Sig: Take 1 tablet (50 mg total) by mouth every 8 (eight) hours as needed.    Dispense:  60 tablet    Refill:  0    Imaging: No results found.  PMFS History: Patient Active Problem List   Diagnosis Date Noted  . Supraspinatus tendon tear, left, initial encounter 09/27/2016   No past medical history on file.  No family history on file.  No past surgical history on file. Social History   Occupational History  . Not on file.   Social History Main Topics  . Smoking status: Current Every Day Smoker    Packs/day: 1.00    Types: Cigarettes  . Smokeless tobacco: Never Used  . Alcohol use No  . Drug use: No  . Sexual activity: Not on file

## 2017-01-25 ENCOUNTER — Telehealth (INDEPENDENT_AMBULATORY_CARE_PROVIDER_SITE_OTHER): Payer: Self-pay | Admitting: Orthopedic Surgery

## 2017-01-25 NOTE — Telephone Encounter (Signed)
ERROR

## 2017-01-26 ENCOUNTER — Ambulatory Visit (INDEPENDENT_AMBULATORY_CARE_PROVIDER_SITE_OTHER): Payer: Worker's Compensation | Admitting: Orthopedic Surgery

## 2017-01-26 DIAGNOSIS — M25511 Pain in right shoulder: Secondary | ICD-10-CM

## 2017-01-26 DIAGNOSIS — G8929 Other chronic pain: Secondary | ICD-10-CM

## 2017-01-26 LAB — CBC WITH DIFFERENTIAL/PLATELET
Basophils Absolute: 0 cells/uL (ref 0–200)
Basophils Relative: 0 %
Eosinophils Absolute: 216 cells/uL (ref 15–500)
Eosinophils Relative: 3 %
HCT: 48.7 % (ref 38.5–50.0)
Hemoglobin: 16.3 g/dL (ref 13.2–17.1)
Lymphocytes Relative: 39 %
Lymphs Abs: 2808 cells/uL (ref 850–3900)
MCH: 30.5 pg (ref 27.0–33.0)
MCHC: 33.5 g/dL (ref 32.0–36.0)
MCV: 91 fL (ref 80.0–100.0)
MPV: 10.3 fL (ref 7.5–12.5)
Monocytes Absolute: 648 cells/uL (ref 200–950)
Monocytes Relative: 9 %
Neutro Abs: 3528 cells/uL (ref 1500–7800)
Neutrophils Relative %: 49 %
Platelets: 246 10*3/uL (ref 140–400)
RBC: 5.35 MIL/uL (ref 4.20–5.80)
RDW: 13.6 % (ref 11.0–15.0)
WBC: 7.2 10*3/uL (ref 3.8–10.8)

## 2017-01-26 NOTE — Progress Notes (Signed)
   Post-Op Visit Note   Patient: Arthur James           Date of Birth: 10/11/1970           MRN: [id number] Visit Date: 01/26/2017 PCP: Patient, No Pcp Per   Assessment & Plan:  Chief Complaint:  Chief Complaint  Patient presents with  . Right Shoulder - Pain   Visit Diagnoses:  1. Chronic right shoulder pain     Plan: Yahia is now several weeks out from right shoulder manipulation under anesthesia and rotator interval release and debridement.  He still is having a hard time getting his range of motion.  Authorization for EMG nerve study is pending.  On exam his EPL FPL interosseous wrist flexion and wrist extension biceps triceps and deltoid strength is intact but he has a very stiff shoulder.  No warmth noted around the shoulder.  I think his shoulder has scarred back and it locked up.  I would like to do a full court press on [place] with lab draw today to look for occult infection.  CBC differential sedimentation rate C-reactive protein ordered.  Also would like to get the nerve study to look for any type of right-sided radiculopathy.  Needs MRI C-spine to evaluate possible neck mediated's origin of his arm weakness.  We'll also like MRI arthrogram of the right shoulder to evaluate the shoulder joint itself.  I'm concerned that prolonged stiffness like this could lead to arthritis.  We may need to consider a second opinion for Mohammed in regards to getting his shoulder moving.  He may need a more extensive capsular release in order to achieve range of motion.  Follow-Up Instructions: Return for after MRI.   Orders:  Orders Placed This Encounter  Procedures  . MR Cervical Spine w/o contrast  . MR SHOULDER RIGHT W CONTRAST  . Arthrogram  . CBC with Differential  . Sed Rate (ESR)  . C-reactive protein   No orders of the defined types were placed in this encounter.   Imaging: No results found.  PMFS History: Patient Active Problem List   Diagnosis Date Noted  .  Supraspinatus tendon tear, left, initial encounter 09/27/2016   No past medical history on file.  No family history on file.  No past surgical history on file. Social History   Occupational History  . Not on file.   Social History Main Topics  . Smoking status: Current Every Day Smoker    Packs/day: 1.00    Types: Cigarettes  . Smokeless tobacco: Never Used  . Alcohol use No  . Drug use: No  . Sexual activity: Not on file

## 2017-01-27 LAB — C-REACTIVE PROTEIN: CRP: 8.6 mg/L — ABNORMAL HIGH (ref <8.0)

## 2017-01-27 LAB — SEDIMENTATION RATE: Sed Rate: 4 mm/hr (ref 0–15)

## 2017-02-22 ENCOUNTER — Telehealth (INDEPENDENT_AMBULATORY_CARE_PROVIDER_SITE_OTHER): Payer: Self-pay | Admitting: Orthopedic Surgery

## 2017-02-22 ENCOUNTER — Ambulatory Visit (INDEPENDENT_AMBULATORY_CARE_PROVIDER_SITE_OTHER): Payer: Worker's Compensation | Admitting: Orthopedic Surgery

## 2017-02-22 DIAGNOSIS — G8929 Other chronic pain: Secondary | ICD-10-CM

## 2017-02-22 DIAGNOSIS — M25511 Pain in right shoulder: Secondary | ICD-10-CM

## 2017-02-22 MED ORDER — OXYCODONE HCL 5 MG PO TABS: 10.0000 mg | ORAL_TABLET | Freq: Four times a day (QID) | ORAL | 0 refills | Status: DC | PRN

## 2017-02-22 NOTE — Progress Notes (Signed)
   Post-Op Visit Note   Patient: Arthur James           Date of Birth: January 31, 1971           MRN: [id number] Visit Date: 02/22/2017 PCP: Patient, No Pcp Per   Assessment & Plan:  Chief Complaint:  Chief Complaint  Patient presents with  . Right Shoulder - Follow-up   Visit Diagnoses:  1. Chronic right shoulder pain     Plan: patient comes in following MRI scan of cervical spine right shoulder as well as EMG nerve studies.  Clinically he is doing about the same.  He is in physical therapy but still has very little functional forward flexion and use of his arm between his neck and elbow.  His MRI of the shoulder shows some artifact changes in the area of the rotator cuff repair but the repair appears to be intact.  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  MRI of the cervical spine shows potential right-sided foraminal encroachment at multiple levels on the right.  This is reviewed with the patient.  EMG nerve study was negative for any type of nerve damage.  Plan at this time is for neurosurgical referral.  I think it would also be helpful for him to have another opinion from either Teryl LucyJoshua Landau or Jackquline BoschJesse Chandler regarding whether or not further capsular releases indicated for his shoulder.I would like to send him to Dr. Wynetta Emeryram or Dr. Yetta BarreJones for evaluation of his neck.  I will see him back in 6 weeks.  Out of work during that time.  Follow-Up Instructions: Return in about 6 weeks (around 04/05/2017).   Orders:  Orders Placed This Encounter  Procedures  . Ambulatory referral to Neurosurgery  . Ambulatory referral to Orthopedic Surgery   Meds ordered this encounter  Medications  . oxyCODONE (OXY IR/ROXICODONE) 5 MG immediate release tablet    Sig: Take 2 tablets (10 mg total) by mouth every 6 (six) hours as needed for severe pain.    Dispense:  60 tablet    Refill:  0    Imaging: No results found.  PMFS History: Patient Active Problem List   Diagnosis Date Noted  .  Supraspinatus tendon tear, left, initial encounter 09/27/2016   No past medical history on file.  No family history on file.  No past surgical history on file. Social History   Occupational History  . Not on file.   Social History Main Topics  . Smoking status: Current Every Day Smoker    Packs/day: 1.00    Types: Cigarettes  . Smokeless tobacco: Never Used  . Alcohol use No  . Drug use: No  . Sexual activity: Not on file

## 2017-02-22 NOTE — Telephone Encounter (Signed)
Plan of Care faxed 02/22/17 °

## 2017-04-05 ENCOUNTER — Encounter (INDEPENDENT_AMBULATORY_CARE_PROVIDER_SITE_OTHER): Payer: Self-pay | Admitting: Orthopedic Surgery

## 2017-04-05 ENCOUNTER — Ambulatory Visit (INDEPENDENT_AMBULATORY_CARE_PROVIDER_SITE_OTHER): Payer: Worker's Compensation | Admitting: Orthopedic Surgery

## 2017-04-05 DIAGNOSIS — M25511 Pain in right shoulder: Secondary | ICD-10-CM | POA: Diagnosis not present

## 2017-04-05 DIAGNOSIS — G8929 Other chronic pain: Secondary | ICD-10-CM

## 2017-04-05 MED ORDER — TRAMADOL HCL 50 MG PO TABS: 50.0000 mg | ORAL_TABLET | Freq: Three times a day (TID) | ORAL | 0 refills | Status: DC | PRN

## 2017-04-05 MED ORDER — METHOCARBAMOL 500 MG PO TABS: 500.0000 mg | ORAL_TABLET | Freq: Three times a day (TID) | ORAL | 0 refills | Status: DC

## 2017-04-05 MED ORDER — OXYCODONE HCL 5 MG PO TABS: 10.0000 mg | ORAL_TABLET | Freq: Four times a day (QID) | ORAL | 0 refills | Status: DC | PRN

## 2017-04-05 NOTE — Progress Notes (Signed)
Office Visit Note   Patient: Arthur James           Date of Birth: [birth year]           MRN: [id number] Visit Date: 04/05/2017 Requested by: No referring provider defined for this encounter. PCP: Patient, No Pcp Per  Subjective: Chief Complaint  Patient presents with  . Right Shoulder - Follow-up    HPI: Patient is here to follow-up for right shoulder.  He's had a difficult postoperative course following his right shoulder surgery.  He's really not any better.  He is going to physical therapy but he has difficulty with active forward flexion and abduction.  He has had rotator cuff repair and post op MRI scan shows integrity of the repair.  Also had distal clavicle excision.  He has had a manipulation as well as rotator interval release but with limited results in terms of regaining his range of motion.  He also has cervical spine issues.  I have requested evaluation by neurosurgery for his cervical spine issues as well as evaluation by another orthopedic surgeon for potential intervention in the right shoulder.  This is really for evaluation and recommendation.              ROS: All systems reviewed are negative as they relate to the chief complaint within the history of present illness.  Patient denies  fevers or chills.   Assessment & Plan: Visit Diagnoses:  1. Chronic right shoulder pain     Plan: Impression is right shoulder pain with a potential component of neck radiculopathy.  He's hurting really from his neck into the trapezial region and also has limited forward flexion and abduction.  All muscles fire.  I think it would be worth neurosurgical consultation in regards to the neck.  For the shoulder I have done a manipulation and interval release but he is reluctant to move the shoulder postoperatively both after the initial surgery as well as after the manipulation.  I'm not sure there is too much more to be done for the shoulder that skin if him predictable pain relief and motion  based on first 2 experiences.  Nonetheless the shoulder is quite stiff and I'm sure the capsule was quite contracted.  I will see him back in 8 weeks but I think for now another opinion from an orthopedic surgeon as well as neurosurgical evaluation is indicated.  Continue with physical therapy.  Continue with Robaxin and tramadol taking oxycodone only as needed.  Follow-Up Instructions: No Follow-up on file.   Orders:  No orders of the defined types were placed in this encounter.  Meds ordered this encounter  Medications  . methocarbamol (ROBAXIN) 500 MG tablet    Sig: Take 1 tablet (500 mg total) by mouth 3 (three) times daily.    Dispense:  30 tablet    Refill:  0  . traMADol (ULTRAM) 50 MG tablet    Sig: Take 1 tablet (50 mg total) by mouth every 8 (eight) hours as needed.    Dispense:  60 tablet    Refill:  0  . oxyCODONE (OXY IR/ROXICODONE) 5 MG immediate release tablet    Sig: Take 2 tablets (10 mg total) by mouth every 6 (six) hours as needed for severe pain.    Dispense:  60 tablet    Refill:  0      Procedures: No procedures performed   Clinical Data: No additional findings.  Objective: Vital Signs: There were no vitals taken  for this visit.  Physical Exam:   Constitutional: Patient appears well-developed HEENT:  Head: Normocephalic Eyes:EOM are normal Neck: Normal range of motion Cardiovascular: Normal rate Pulmonary/chest: Effort normal Neurologic: Patient is alert Skin: Skin is warm Psychiatric: Patient has normal mood and affect    Ortho Exam: Orthopedic exam demonstrates no skin changes or temperature difference right shoulder versus left shoulder.  Does localize pain in the trapezial region radiating into the neck on the right-hand side.  Deltoid biceps triceps all fire on the right left-hand side.  He does have tight capsule on the right shoulder with external rotation only to about 25.  For flexion and abduction both passively less than 90.  Deltoid  to begin is palpable and intact.  Specialty Comments:  No specialty comments available.  Imaging: No results found.   PMFS History: Patient Active Problem List   Diagnosis Date Noted  . Supraspinatus tendon tear, left, initial encounter 09/27/2016   No past medical history on file.  No family history on file.  No past surgical history on file. Social History   Occupational History  . Not on file.   Social History Main Topics  . Smoking status: Current Every Day Smoker    Packs/day: 1.00    Types: Cigarettes  . Smokeless tobacco: Never Used  . Alcohol use No  . Drug use: No  . Sexual activity: Not on file

## 2017-04-11 ENCOUNTER — Other Ambulatory Visit (INDEPENDENT_AMBULATORY_CARE_PROVIDER_SITE_OTHER): Payer: Self-pay

## 2017-04-11 DIAGNOSIS — IMO0001 Reserved for inherently not codable concepts without codable children: Secondary | ICD-10-CM

## 2017-04-11 DIAGNOSIS — S46812A Strain of other muscles, fascia and tendons at shoulder and upper arm level, left arm, initial encounter: Principal | ICD-10-CM

## 2017-05-03 ENCOUNTER — Encounter (INDEPENDENT_AMBULATORY_CARE_PROVIDER_SITE_OTHER): Payer: Self-pay | Admitting: Orthopedic Surgery

## 2017-05-03 ENCOUNTER — Ambulatory Visit (INDEPENDENT_AMBULATORY_CARE_PROVIDER_SITE_OTHER): Payer: Worker's Compensation

## 2017-05-03 ENCOUNTER — Ambulatory Visit (INDEPENDENT_AMBULATORY_CARE_PROVIDER_SITE_OTHER): Payer: Worker's Compensation | Admitting: Orthopedic Surgery

## 2017-05-03 DIAGNOSIS — G8929 Other chronic pain: Secondary | ICD-10-CM

## 2017-05-03 DIAGNOSIS — M25511 Pain in right shoulder: Secondary | ICD-10-CM | POA: Diagnosis not present

## 2017-05-03 MED ORDER — GABAPENTIN 100 MG PO CAPS: 100.0000 mg | ORAL_CAPSULE | Freq: Three times a day (TID) | ORAL | 0 refills | Status: DC

## 2017-05-05 NOTE — Progress Notes (Signed)
Office Visit Note   Patient: Arthur James           Date of Birth: 03/22/1971           MRN: [id number] Visit Date: 05/03/2017 Requested by: No referring provider defined for this encounter. PCP: Patient, No Pcp Per  Subjective: Chief Complaint  Patient presents with  . Right Shoulder - Pain    HPI: Mom is a 46 year old patient is now 8 months out right shoulder arthroscopy distal clavicle excision and rotator cuff repair.  He has had a tumultuous recovery.  Repeat MRI scanning shows competency of the rotator cuff repair.  He has had frozen shoulder and weakness since the surgery.  Attempt at manipulation and capsular release did not give meaningful restoration of motion.  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  I have recommended another opinion for recommendations on surgical management of the shoulder.  Neurosurgical evaluation also pending.  Discussed all this today with an Parfitt who is the adjustor for this claim.              ROS: All systems reviewed are negative as they relate to the chief complaint within the history of present illness.  Patient denies  fevers or chills.   Assessment & Plan: Visit Diagnoses:  1. Chronic right shoulder pain     Plan: Impression is persistent right shoulder stiffness status post rotator cuff repair and subsequent manipulation with capsular release.  The patient is also developed a new tremor in the right arm.  He doesn't have much in the way of color difference or skin temperature difference between right and left arm but I think reflex sympathetic dystrophy is a consideration in this case.  He also has continued severe restricted range of motion passively.  Really the question at hand is whether or not another type of arthroscopic release is indicated to increase motion or whether or not he needs an open release.  Difficult to say at this time based on his response to the initial capsule release and manipulation.  I am going to have him  continue with physical therapy for range of motion.  I'll add Neurontin.  I'll also like him to be seen by a physician specializing in reflex sympathetic dystrophy.  Continue out of work for 3 months.  Do not see maximal medical improvement anytime in the near future until neurosurgical evaluation and another opinion about further treatment for the shoulder can be obtained  Follow-Up Instructions: Return in about 8 weeks (around 06/28/2017).   Orders:  Orders Placed This Encounter  Procedures  . XR Shoulder Right   Meds ordered this encounter  Medications  . gabapentin (NEURONTIN) 100 MG capsule    Sig: Take 1 capsule (100 mg total) by mouth 3 (three) times daily.    Dispense:  90 capsule    Refill:  0      Procedures: No procedures performed   Clinical Data: No additional findings.  Objective: Vital Signs: There were no vitals taken for this visit.  Physical Exam:   Constitutional: Patient appears well-developed HEENT:  Head: Normocephalic Eyes:EOM are normal Neck: Normal range of motion Cardiovascular: Normal rate Pulmonary/chest: Effort normal Neurologic: Patient is alert Skin: Skin is warm Psychiatric: Patient has normal mood and affect    Ortho Exam: Orthopedic exam demonstrates persistent limitation of passive range of motion to about 10 of external rotation on the right.  Motor sensory function to the hand is intact.  No clear temperature  differences or skin color difference is right versus left arm.  Radial pulses intact.  No other masses lymph adenopathy or skin changes noted in the shoulder girdle region.  He has general tenderness around the deltoid region with radiation up into the neck and trapezial region.  Specialty Comments:  No specialty comments available.  Imaging: No results found.   PMFS History: Patient Active Problem List   Diagnosis Date Noted  . Supraspinatus tendon tear, left, initial encounter 09/27/2016   No past medical history on  file.  No family history on file.  No past surgical history on file. Social History   Occupational History  . Not on file.   Social History Main Topics  . Smoking status: Current Every Day Smoker    Packs/day: 1.00    Types: Cigarettes  . Smokeless tobacco: Never Used  . Alcohol use No  . Drug use: No  . Sexual activity: Not on file

## 2017-05-16 ENCOUNTER — Ambulatory Visit (INDEPENDENT_AMBULATORY_CARE_PROVIDER_SITE_OTHER): Payer: Worker's Compensation | Admitting: Orthopedic Surgery

## 2017-05-16 ENCOUNTER — Encounter (INDEPENDENT_AMBULATORY_CARE_PROVIDER_SITE_OTHER): Payer: Self-pay | Admitting: Orthopedic Surgery

## 2017-05-16 DIAGNOSIS — M25511 Pain in right shoulder: Secondary | ICD-10-CM | POA: Diagnosis not present

## 2017-05-16 DIAGNOSIS — G8929 Other chronic pain: Secondary | ICD-10-CM | POA: Diagnosis not present

## 2017-05-18 NOTE — Progress Notes (Signed)
Office Visit Note   Patient: Arthur James           Date of Birth: [birth year]           MRN: [id number] Visit Date: 05/16/2017 Requested by: No referring provider defined for this encounter. PCP: Patient, No Pcp Per  Subjective: Chief Complaint  Patient presents with  . Right Shoulder - Follow-up    HPI: Arthur James is a 46 year old patient here for follow-up of right shoulder issues.  He is now approximately 9 months out from surgery.  He has been denied continued physical therapy by workman's comp.  We have tried for 2 second opinions regarding his shoulder situation here in town but they have both been refused.  Neurosurgical appointment for right-sided nerve compression also pending.  He's having continued pain and stiffness and functional disability in the right shoulder.              ROS: All systems reviewed are negative as they relate to the chief complaint within the history of present illness.  Patient denies  fevers or chills.   Assessment & Plan: Visit Diagnoses:  1. 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  Plan is to continue with home exercise program of shoulder range of motion is much as possible.  The real issue here is whether or not further surgical intervention on the shoulder is indicated in order to achieve more motion.  I think also an evaluation by a physician specializing in chronic regional pain syndrome is indicated.  His collar and temperature of the arm is pretty similar to the left-hand side but I think this is still indicated based on his prolonged recovery.  Follow-Up Instructions: Return in about 8 weeks (around 07/11/2017).   Orders:  Orders Placed This Encounter  Procedures  . Ambulatory referral to Pain Clinic   No orders of the defined types were placed in this  encounter.     Procedures: No procedures performed   Clinical Data: No additional findings.  Objective: Vital Signs: There were no vitals taken for this visit.  Physical Exam:   Constitutional: Patient appears well-developed HEENT:  Head: Normocephalic Eyes:EOM are normal Neck: Normal range of motion Cardiovascular: Normal rate Pulmonary/chest: Effort normal Neurologic: Patient is alert Skin: Skin is warm Psychiatric: Patient has normal mood and affect    Ortho Exam: Orthopedic exam demonstrates about 5 of external rotation at 15 of abduction.  No vitals temperature difference or skin color difference right versus left other than slight Lee less and right arm which the daughter through translation says is because his left arm is the one that on the driver's side were signed.  His deltoid fires.  Fort flexion and abduction both well below 90.  His shoulder is located.  Motor sensory function to the hand intact.  Specialty Comments:  No specialty comments available.  Imaging: No results found.   PMFS History: Patient Active Problem List   Diagnosis Date Noted  . Supraspinatus tendon tear, left, initial encounter 09/27/2016   No past medical history on file.  No family history on file.  No past surgical history on file. Social History   Occupational History  . Not on file.   Social History Main Topics  . Smoking status: Current Every Day Smoker    Packs/day: 1.00    Types:  Cigarettes  . Smokeless tobacco: Never Used  . Alcohol use No  . Drug use: No  . Sexual activity: Not on file

## 2017-06-11 ENCOUNTER — Encounter (INDEPENDENT_AMBULATORY_CARE_PROVIDER_SITE_OTHER): Payer: Self-pay | Admitting: Orthopedic Surgery

## 2017-06-11 ENCOUNTER — Ambulatory Visit (INDEPENDENT_AMBULATORY_CARE_PROVIDER_SITE_OTHER): Payer: Worker's Compensation | Admitting: Orthopedic Surgery

## 2017-06-11 DIAGNOSIS — M25511 Pain in right shoulder: Secondary | ICD-10-CM | POA: Diagnosis not present

## 2017-06-11 MED ORDER — OXYCODONE HCL 5 MG PO TABS: ORAL_TABLET | ORAL | 0 refills | Status: DC

## 2017-06-12 ENCOUNTER — Telehealth (INDEPENDENT_AMBULATORY_CARE_PROVIDER_SITE_OTHER): Payer: Self-pay

## 2017-06-12 NOTE — Telephone Encounter (Signed)
Patient was seen 11/12 by Dr August Saucerean. Dr. August Saucerean is asking for notes from patients second opinion referral with Dr Ave Filterhandler. Also, he wants to know the status of the referral to neurosurgery.

## 2017-06-13 ENCOUNTER — Encounter (INDEPENDENT_AMBULATORY_CARE_PROVIDER_SITE_OTHER): Payer: Self-pay | Admitting: Orthopedic Surgery

## 2017-06-13 NOTE — Telephone Encounter (Signed)
Emailed Facilities managerAnne Hartnett asking her to email the report as well as give an update on the neurosurgeon appt

## 2017-06-13 NOTE — Progress Notes (Signed)
Office Visit Note   Patient: Arthur James           Date of Birth: 1970/10/09           MRN: [id number] Visit Date: 06/11/2017 Requested by: No referring provider defined for this encounter. PCP: Patient, No Pcp Per  Subjective: Chief Complaint  Patient presents with  . Right Shoulder - Follow-up    HPI: Patient presents for follow-up of his right shoulder.  Since I have seen him he has seen Dr. Ave Filterhandler for second opinion.  I do not have the results of that note.  He reports continued pain from the shoulder blade region into the top of the shoulder into the elbow but not below the elbow.  He has known cervical disc pathology but has not seen neurosurgery yet.  Helping a tremor in that right hand.  He had that at last clinic visit.  With the cold weather his arm symptoms are getting worse.              ROS: All systems reviewed are negative as they relate to the chief complaint within the history of present illness.  Patient denies  fevers or chills.   Assessment & Plan: Visit Diagnoses:  1. Right shoulder pain, unspecified chronicity     Plan: Impression is persistent right shoulder pain and frozen shoulder with limited forward flexion strength.  Needs neurosurgical evaluation and once I reviewed the second opinion from the other physician I will call his son who speaks good [place] and we can discuss further management.  I do think that we need neurosurgical input before considering any other type of surgical intervention on the shoulder.  The real question here is whether or not there is any way to release that capsule to achieve a better range of motion.  He has had nerve study which shows all the nerves are functional in the arm.  I think this is a combination of frozen shoulder and cervical disc pathology giving him his constellation of symptoms.  Follow-Up Instructions: Return in about 8 weeks (around 08/06/2017).   Orders:  No orders of the defined types were placed in this  encounter.  Meds ordered this encounter  Medications  . oxyCODONE (OXY IR/ROXICODONE) 5 MG immediate release tablet    Sig: 1 po q hs prn pain    Dispense:  45 tablet    Refill:  0      Procedures: No procedures performed   Clinical Data: No additional findings.  Objective: Vital Signs: There were no vitals taken for this visit.  Physical Exam:   Constitutional: Patient appears well-developed HEENT:  Head: Normocephalic Eyes:EOM are normal Neck: Normal range of motion Cardiovascular: Normal rate Pulmonary/chest: Effort normal Neurologic: Patient is alert Skin: Skin is warm Psychiatric: Patient has normal mood and affect    Ortho Exam: Orthopedic exam demonstrates forward flexion to about 50.  Cuff strength is reasonable.  I'll detect much in the way of color change or temperature difference right arm versus left.  Muscles are functional and fire on the right arm and left arm.  There is a tremor in his hand.  Neck range of motion is painful.  He localizes pain to the shoulder blade region as well.  Specialty Comments:  No specialty comments available.  Imaging: No results found.   PMFS History: Patient Active Problem List   Diagnosis Date Noted  . Supraspinatus tendon tear, left, initial encounter 09/27/2016   History reviewed. No pertinent past  medical history.  History reviewed. No pertinent family history.  History reviewed. No pertinent surgical history. Social History   Occupational History  . Not on file  Tobacco Use  . Smoking status: Current Every Day Smoker    Packs/day: 1.00    Types: Cigarettes  . Smokeless tobacco: Never Used  Substance and Sexual Activity  . Alcohol use: No  . Drug use: No  . Sexual activity: Not on file

## 2017-06-18 NOTE — Telephone Encounter (Signed)
Dr. Selina Cooleyhandlers note received and per email from adj she is still working on neurosurgeon referral

## 2017-06-26 ENCOUNTER — Telehealth (INDEPENDENT_AMBULATORY_CARE_PROVIDER_SITE_OTHER): Payer: Self-pay | Admitting: Radiology

## 2017-06-26 NOTE — Telephone Encounter (Signed)
Pls call his case worker to have her approve [facility]su referral and [facility]sd referral thx

## 2017-06-26 NOTE — Telephone Encounter (Signed)
FYI only, no action needed.  Pharmacy called and said that Medicaid will only cover 21 days of the oxycodone.  I advised ok to fill last Rx for #21 only, instead of the #45.

## 2017-06-27 NOTE — Telephone Encounter (Signed)
Please see note from Dr August Saucerean. Any updates on these referrals for neurosurgery and pain clinic for RSD for this patient from case mgr?

## 2017-06-27 NOTE — Telephone Encounter (Signed)
Left message for and emailed Homero Fellersnne Hartnett for  status update on this referral

## 2017-07-03 ENCOUNTER — Telehealth (INDEPENDENT_AMBULATORY_CARE_PROVIDER_SITE_OTHER): Payer: Self-pay | Admitting: Orthopedic Surgery

## 2017-07-03 NOTE — Telephone Encounter (Signed)
[facility] 05/16/2017- present [facility]

## 2017-07-03 NOTE — Telephone Encounter (Signed)
Per email from LyonsAnne, she is still working on this referral

## 2017-07-06 ENCOUNTER — Telehealth (INDEPENDENT_AMBULATORY_CARE_PROVIDER_SITE_OTHER): Payer: Self-pay | Admitting: Orthopedic Surgery

## 2017-07-06 NOTE — Telephone Encounter (Signed)
04/05/2017 ov note emailed to Aimee e/ [facility]. That was only DOS missing

## 2017-08-09 ENCOUNTER — Encounter (INDEPENDENT_AMBULATORY_CARE_PROVIDER_SITE_OTHER): Payer: Self-pay | Admitting: Orthopedic Surgery

## 2017-08-09 ENCOUNTER — Ambulatory Visit (INDEPENDENT_AMBULATORY_CARE_PROVIDER_SITE_OTHER): Payer: Medicaid Other | Admitting: Orthopedic Surgery

## 2017-08-09 DIAGNOSIS — M542 Cervicalgia: Secondary | ICD-10-CM

## 2017-08-09 DIAGNOSIS — M79601 Pain in right arm: Secondary | ICD-10-CM | POA: Diagnosis not present

## 2017-08-10 ENCOUNTER — Encounter (INDEPENDENT_AMBULATORY_CARE_PROVIDER_SITE_OTHER): Payer: Self-pay | Admitting: Orthopedic Surgery

## 2017-08-10 NOTE — Progress Notes (Signed)
Office Visit Note   Patient: Arthur James           Date of Birth: 05/05/1971           MRN: [id number] Visit Date: 08/09/2017 Requested by: No referring provider defined for this encounter. PCP: Patient, No Pcp Per  Subjective: Chief Complaint  Patient presents with  . Right Shoulder - Pain, Follow-up    HPI: Arthur James is a patient who is now 11 months out right shoulder rotator cuff tear repair.  He has had a rocky postoperative course complicated by shoulder stiffness and possible reflux and pathetic dystrophy.  Since I have seen him as it appears that he has settled with Workmen's Comp.  He also has a cervical disc problem and reports pain in the neck radiating into the shoulder.  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  He has had an EMG nerve study which was normal.  He has had an MRI scan following surgery which also shows rotator cuff repair to be intact but he has had significant stiffness since the surgery.  He states that from mid humerus down to the hand he is fine but in the shoulder region he is very painful and stiff.  His original translator is with him today.              ROS: All systems reviewed are negative as they relate to the chief complaint within the history of present illness.  Patient denies  fevers or chills.   Assessment & Plan: Visit Diagnoses:  1. Neck pain   2. Right arm pain     Plan: Impression is right shoulder stiffness following rotator cuff repair status post one attempt at manipulation with capsular release which did not work.  Plan is referral to pain specialist to rule out reflex sympathetic dystrophy.  If he does not have dystrophy then I think consideration could be given to repeat capsular release.  That was the opinion given also by another physician who I requested a second opinion from.  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  I would also like for him to see a neurosurgeon regarding his neck disc.  Before seeing the neurosurgeon however, I would like for him to see Dr. Alvester Morin for an injection into the neck to see if that relieves any of his symptoms.  For now the action plan is for cervical spine ESI and referral to pain specialist to rule in or rule out dystrophy.  Follow-Up Instructions: No Follow-up on file.   Orders:  Orders Placed This Encounter  Procedures  . Ambulatory referral to Physical Medicine Rehab  . Ambulatory referral to Pain Clinic   No orders of the defined types were placed in this encounter.     Procedures: No procedures performed   Clinical Data: No additional findings.  Objective: Vital Signs: There were no vitals taken for this visit.  Physical Exam:   Constitutional: Patient appears well-developed HEENT:  Head: Normocephalic Eyes:EOM are normal Neck: Normal range of motion Cardiovascular: Normal rate Pulmonary/chest: Effort normal Neurologic: Patient is alert Skin: Skin is warm Psychiatric: Patient has normal mood and affect    Ortho Exam: Orthopedic exam demonstrates slight improvement in external rotation at 50 degrees of abduction to about 20 degrees on the right.  He still  has limited forward flexion and abduction due to shoulder stiffness and some degree of weakness of the deltoid.  His deltoid is firing.  He does not really have much in the way of skin color differences or temperature difference in his right arm versus left arm.  Neck range of motion is full but he does have some pain with rotation to the right.  No real muscle atrophy in the proximal arm or forearm region on either side.  No paresthesias C5-T1.  The deltoid muscle does fire in the anterior middle and posterior regions.  Specialty Comments:  No specialty comments available.  Imaging: No results found.   PMFS History: Patient Active  Problem List   Diagnosis Date Noted  . Supraspinatus tendon tear, left, initial encounter 09/27/2016   History reviewed. No pertinent past medical history.  History reviewed. No pertinent family history.  History reviewed. No pertinent surgical history. Social History   Occupational History  . Not on file  Tobacco Use  . Smoking status: Current Every Day Smoker    Packs/day: 1.00    Types: Cigarettes  . Smokeless tobacco: Never Used  Substance and Sexual Activity  . Alcohol use: No  . Drug use: No  . Sexual activity: Not on file

## 2017-08-30 ENCOUNTER — Ambulatory Visit (INDEPENDENT_AMBULATORY_CARE_PROVIDER_SITE_OTHER): Payer: Medicaid Other | Admitting: Physical Medicine and Rehabilitation

## 2017-08-30 ENCOUNTER — Encounter (INDEPENDENT_AMBULATORY_CARE_PROVIDER_SITE_OTHER): Payer: Self-pay | Admitting: Physical Medicine and Rehabilitation

## 2017-08-30 ENCOUNTER — Ambulatory Visit (INDEPENDENT_AMBULATORY_CARE_PROVIDER_SITE_OTHER): Payer: Medicaid Other

## 2017-08-30 VITALS — BP 133/98 | HR 101 | Temp 97.9°F

## 2017-08-30 DIAGNOSIS — M5010 Cervical disc disorder with radiculopathy, unspecified cervical region: Secondary | ICD-10-CM

## 2017-08-30 DIAGNOSIS — M5412 Radiculopathy, cervical region: Secondary | ICD-10-CM | POA: Diagnosis not present

## 2017-08-30 MED ORDER — METHYLPREDNISOLONE ACETATE 80 MG/ML IJ SUSP
80.0000 mg | Freq: Once | INTRAMUSCULAR | Status: AC
  Administered 2017-08-30: 80 mg

## 2017-08-30 NOTE — Progress Notes (Deleted)
Pt states constant pain in neck and right shoulder with some numbness and tingling. Pt states pain in neck and right shoulder post surgery by Dr. August Saucerean. Pt states anything cold or hot makes pain worse, nothing makes pain better. +Driver,-BT,-Dye Allergies.

## 2017-08-30 NOTE — Patient Instructions (Signed)

## 2017-08-31 NOTE — Procedures (Signed)
Cervical Epidural Steroid Injection - Interlaminar Approach with Fluoroscopic Guidance  Patient: Arthur James      Date of Birth: 06/18/1971 MRN: [id number] PCP: Patient, No Pcp Per      Visit Date: 08/30/2017   Universal Protocol:    Date/Time: 02/01/196:20 AM  Consent Given By: the patient  Position: PRONE  Additional Comments: Vital signs were monitored before and after the procedure. Patient was prepped and draped in the usual sterile fashion. The correct patient, procedure, and site was verified.   Injection Procedure Details:  Procedure Site One Meds Administered:  Meds ordered this encounter  Medications  . methylPREDNISolone acetate (DEPO-MEDROL) injection 80 mg     Laterality: Right  Location/Site:  C7-T1  Needle size: 20 G  Needle type: Touhy  Needle Placement: Paramedian epidural space  Findings:  -Comments: Excellent flow of contrast into the epidural space.  Procedure Details: Using a paramedian approach from the side mentioned above, the region overlying the inferior lamina was localized under fluoroscopic visualization and the soft tissues overlying this structure were infiltrated with 4 ml. of 1% Lidocaine without Epinephrine. A # 20 gauge, Tuohy needle was inserted into the epidural space using a paramedian approach.  The epidural space was localized using loss of resistance along with lateral and contralateral oblique bi-planar fluoroscopic views.  After negative aspirate for air, blood, and CSF, a 2 ml. volume of Isovue-250 was injected into the epidural space and the flow of contrast was observed. Radiographs were obtained for documentation purposes.   The injectate was administered into the level noted above.  Additional Comments:  The patient tolerated the procedure well Dressing: Band-Aid    Post-procedure details: Patient was observed during the procedure. Post-procedure instructions were reviewed.  Patient left the clinic in stable  condition.   Pertinent Imaging: Cervical Spine MRI 02/14/2017  C4-5 right disc protrusion and spondylosis. No central canal stenosis  MRI arthrogram Right shoulder 09/27/2016 IMPRESSION: Full-thickness, full width mildly retracted tear of the supraspinatus tendon without muscle atrophy.  Mild acromioclavicular osteoarthritis

## 2017-08-31 NOTE — Progress Notes (Signed)
Fredrik RiggerMohamad Sando - 47 y.o. male MRN [id number]  Date of birth: 1971/06/25  Office Visit Note: Visit Date: 08/30/2017 PCP: Patient, No Pcp Per Referred by: No ref. provider found  Subjective: Chief Complaint  Patient presents with  . Neck - Numbness, Pain, Tingling  . Right Shoulder - Pain, Numbness, Tingling   HPI: Mr. Lacinda AxonJnide is a 47 year old right-hand-dominant gentleman comes in today at the request of Dr. August Saucerean for diagnostic and hopefully therapeutic right C7-T1 interlaminar epidural steroid injection.  His brief history as best as I can determine from the notes and the patient using an interpreter today is that he had a work-related injury 2018 to his shoulder.  He subsequently had MRI findings of supraspinatus tear.  He went on to have arthroscopic repair.  According to Dr. August Saucerean the patient had a pretty rocky recovery at this point is having increased neck and shoulder pain radiating into the hand with some hand tremor.  Dr. August Saucerean has consulted a neurosurgeon I believe Dr. Wynetta Emerycram.  He is also consulted Dr. Isabell JarvisBertran and Dr. Ollen BowlHarkins for possible complex regional pain syndrome (reflex sympathetic dystrophy).  I am unsure today talking with the patient if those consultations have actually been performed.  There are no notes in the chart.  He has had a normal electrodiagnostic study.  He reports after the electrodiagnostic study that he had increased pain.  He does have an MRI from July 2018 of the cervical spine with right disc protrusion but no specific canal stenosis or frank nerve compression.  The numbness and tingling in the right arm is somewhat nondermatomal.  He does report that the neck and right shoulder pain worsened since the shoulder surgery and worsened since the electrodiagnostic study.  He reports anything cold or hot makes his pain worse.  He reports nothing makes it better.  He is taking opioid medications.  He has also taken some gabapentin and muscle relaxers.    ROS Otherwise per  HPI.  Assessment & Plan: Visit Diagnoses:  1. Cervical radiculopathy   2. Cervical disc disorder with radiculopathy     Plan: Findings:  Diagnostic and hopefully therapeutic right C7-T1 interlaminar epidural steroid injection.  There was excellent flow of contrast placed epidural injection.  He will follow-up with Dr. August Saucerean has other referrals when those are completed.    Meds & Orders:  Meds ordered this encounter  Medications  . methylPREDNISolone acetate (DEPO-MEDROL) injection 80 mg    Orders Placed This Encounter  Procedures  . XR C-ARM NO REPORT  . Epidural Steroid injection    Follow-up: Return if symptoms worsen or fail to improve.   Procedures: No procedures performed  No notes on file   Clinical History: Cervical Spine MRI 02/14/2017  C4-5 right disc protrusion and spondylosis. No central canal stenosis.  He reports that he has been smoking cigarettes.  He has been smoking about 1.00 pack per day. he has never used smokeless tobacco. No results for input(s): HGBA1C, LABURIC in the last 8760 hours.  Objective:  VS:  HT:    WT:   BMI:     BP:(!) 133/98  HR:(!) 101bpm  TEMP:97.9 F (36.6 C)(Oral)  RESP:98 % Physical Exam  Musculoskeletal:  Patient really is unwilling to rotate his neck to the right.  He does have pain with neck rotation.  He has very limited range of motion Duda pain.  He also holds his arm in a flexed position most of the time.  He does not want  to rotate or abduct the arm at the shoulder.  He gives very poor effort with examination of strength in the hand.  There appears to be some mild edema on the right compared to left.  There is no discoloration or increased sweating.    Ortho Exam Imaging: Xr C-arm No Report  Result Date: 08/30/2017 Please see Notes or Procedures tab for imaging impression.   Past Medical/Family/Surgical/Social History: Medications & Allergies reviewed per EMR Patient Active Problem List   Diagnosis Date Noted  .  Supraspinatus tendon tear, left, initial encounter 09/27/2016   History reviewed. No pertinent past medical history. History reviewed. No pertinent family history. History reviewed. No pertinent surgical history. Social History   Occupational History  . Not on file  Tobacco Use  . Smoking status: Current Every Day Smoker    Packs/day: 1.00    Types: Cigarettes  . Smokeless tobacco: Never Used  Substance and Sexual Activity  . Alcohol use: No  . Drug use: No  . Sexual activity: Not on file

## 2017-09-03 ENCOUNTER — Telehealth (INDEPENDENT_AMBULATORY_CARE_PROVIDER_SITE_OTHER): Payer: Self-pay | Admitting: Physical Medicine and Rehabilitation

## 2017-09-04 NOTE — Telephone Encounter (Signed)
Please inform patient that I reviewed images from procedure and there was no complications. The feeling in his head and dizzyness is unrelated to injection. He likely has some level of myofascial pain and if injection did not help then Dr. August Saucerean will review. He might be better served in comprhensive pain mangement but Dr. August Saucerean can weigh in.

## 2017-09-04 NOTE — Telephone Encounter (Signed)
Patient notified through interpreter.

## 2017-09-13 ENCOUNTER — Encounter (INDEPENDENT_AMBULATORY_CARE_PROVIDER_SITE_OTHER): Payer: Self-pay | Admitting: Orthopedic Surgery

## 2017-09-13 ENCOUNTER — Ambulatory Visit (INDEPENDENT_AMBULATORY_CARE_PROVIDER_SITE_OTHER): Payer: Medicaid Other | Admitting: Orthopedic Surgery

## 2017-09-13 DIAGNOSIS — Z9889 Other specified postprocedural states: Secondary | ICD-10-CM | POA: Diagnosis not present

## 2017-09-13 MED ORDER — OXYCODONE HCL 5 MG PO CAPS: ORAL_CAPSULE | ORAL | 0 refills | Status: DC

## 2017-09-15 ENCOUNTER — Encounter (INDEPENDENT_AMBULATORY_CARE_PROVIDER_SITE_OTHER): Payer: Self-pay | Admitting: Orthopedic Surgery

## 2017-09-15 NOTE — Progress Notes (Signed)
Office Visit Note   Patient: Arthur RiggerMohamad James           Date of Birth: June 22, 1971           MRN: [id number] Visit Date: 09/13/2017 Requested by: No referring provider defined for this encounter. PCP: Patient, No Pcp Per  Subjective: Chief Complaint  Patient presents with  . Right Shoulder - Pain    HPI: Arthur James presents for follow-up of right shoulder and neck pain.  Since I have seen him he has had cervical epidural steroid injection 08/30/2017.  That did not give him much relief and he states he is no better.  Still reports being in constant pain.  He is doing home exercises.  After the injection he states his head felt heavy.  He is seeing another physician to be evaluated for chronic regional pain syndrome.  He would like to have some type of medication that will help him sleep.              ROS: All systems reviewed are negative as they relate to the chief complaint within the history of present illness.  Patient denies  fevers or chills.   Assessment & Plan: Visit Diagnoses:  1. S/P right rotator cuff repair     Plan: Impression is improvement in passive range of motion of the right shoulder compared to last clinic visit.  I think this is good baseline for potential improvement in strength.  Prior to achieving any improvement in range of motion his strength and functional ability in the shoulder will be limited.  He is making slow and steady improvement and loosening the shoulder up.  Still has some strength deficits but all workup to date has been negative in regards to her nerve problem.  I would favor continuing the home exercise program.  We will see if the podiatrist believes there is any evidence of chronic regional pain syndrome.  Not then my decision making was potentially considering some type of release to achieve better range of motion but since he is improving on his own I do not think any surgery is on the table at this time.  Follow-up at the end of March.  Oxycodone  prescribed but one time only.  I do not want him to get in the habit of taking opioids at this time.  Follow-Up Instructions: Return in about 8 weeks (around 11/08/2017).   Orders:  No orders of the defined types were placed in this encounter.  Meds ordered this encounter  Medications  . oxycodone (OXY-IR) 5 MG capsule    Sig: 1 po tid prn pain    Dispense:  64 capsule    Refill:  0      Procedures: No procedures performed   Clinical Data: No additional findings.  Objective: Vital Signs: There were no vitals taken for this visit.  Physical Exam:   Constitutional: Patient appears well-developed HEENT:  Head: Normocephalic Eyes:EOM are normal Neck: Normal range of motion Cardiovascular: Normal rate Pulmonary/chest: Effort normal Neurologic: Patient is alert Skin: Skin is warm Psychiatric: Patient has normal mood and affect    Ortho Exam: Orthopedic exam demonstrates improvement in external rotation at 15 degrees of abduction.  Today is to about 30 or 35 degrees which is definitely more than it has been in the past.  Isolated forward flexion and abduction still approaching that also is improved.  Her functional strength however is not improve considering forward flexion.  No coarse grinding or crepitus with passive range  of motion of the shoulder on the right.  I do not detect any skin color changes or temperature differences right versus left arm.  Specialty Comments:  No specialty comments available.  Imaging: No results found.   PMFS History: Patient Active Problem List   Diagnosis Date Noted  . Supraspinatus tendon tear, left, initial encounter 09/27/2016   History reviewed. No pertinent past medical history.  History reviewed. No pertinent family history.  History reviewed. No pertinent surgical history. Social History   Occupational History  . Not on file  Tobacco Use  . Smoking status: Current Every Day Smoker    Packs/day: 1.00    Types: Cigarettes   . Smokeless tobacco: Never Used  Substance and Sexual Activity  . Alcohol use: No  . Drug use: No  . Sexual activity: Not on file

## 2017-09-18 ENCOUNTER — Telehealth (INDEPENDENT_AMBULATORY_CARE_PROVIDER_SITE_OTHER): Payer: Self-pay

## 2017-09-18 NOTE — Telephone Encounter (Signed)
IC patient pharmacy [facility] [place] and advised denied.

## 2017-09-18 NOTE — Telephone Encounter (Signed)
Try t 3 1 po q 8 and let pt know med denied thx

## 2017-09-18 NOTE — Telephone Encounter (Signed)
I tried calling, male voice answered but  call was disconnected.  Will try again later.

## 2017-09-18 NOTE — Telephone Encounter (Signed)
Received notification yesterday from patients pharmacy that oxycodone 5mg  needed prior auth with patients medicaid.  I submitted online yesterday and received notification today that the coverage of this medication was denied by patients insurance. Do you want to change to anything different?

## 2017-09-27 ENCOUNTER — Telehealth (INDEPENDENT_AMBULATORY_CARE_PROVIDER_SITE_OTHER): Payer: Self-pay | Admitting: Orthopedic Surgery

## 2017-09-27 NOTE — Telephone Encounter (Signed)
Patient called stating that he is needing a pre authorization for his oxycodone if the pharmacy could be contacted. CB # [phone]

## 2017-09-27 NOTE — Telephone Encounter (Signed)
LM for him advising insurance denied coverage of medication

## 2017-09-28 ENCOUNTER — Other Ambulatory Visit (INDEPENDENT_AMBULATORY_CARE_PROVIDER_SITE_OTHER): Payer: Self-pay

## 2017-09-28 ENCOUNTER — Telehealth (INDEPENDENT_AMBULATORY_CARE_PROVIDER_SITE_OTHER): Payer: Self-pay | Admitting: Orthopedic Surgery

## 2017-09-28 MED ORDER — HYDROCODONE-ACETAMINOPHEN 5-325 MG PO TABS: ORAL_TABLET | ORAL | 0 refills | Status: DC

## 2017-09-28 NOTE — Telephone Encounter (Signed)
?   They dont cover oxy - how about percocet 1 po q 12 same number and did he get the other one filled

## 2017-09-28 NOTE — Telephone Encounter (Signed)
Medicaid approved norco 5/325 1 po bid prn pain #60

## 2017-09-28 NOTE — Telephone Encounter (Signed)
Will see if I can get percocet auth'd  Medicaid denied coverage of oxycodone

## 2017-09-28 NOTE — Telephone Encounter (Signed)
Can patient be prescribed a different pain medication that Medicaid will cover because oxycodone is not. Please advise # [phone]

## 2017-09-28 NOTE — Telephone Encounter (Signed)
Please advise. Thanks.  

## 2017-11-07 ENCOUNTER — Encounter (INDEPENDENT_AMBULATORY_CARE_PROVIDER_SITE_OTHER): Payer: Self-pay | Admitting: Orthopedic Surgery

## 2017-11-07 ENCOUNTER — Ambulatory Visit (INDEPENDENT_AMBULATORY_CARE_PROVIDER_SITE_OTHER): Payer: Medicaid Other | Admitting: Orthopedic Surgery

## 2017-11-07 DIAGNOSIS — Z9889 Other specified postprocedural states: Secondary | ICD-10-CM

## 2017-11-07 MED ORDER — OXYCODONE HCL 5 MG PO TABS: ORAL_TABLET | ORAL | 0 refills | Status: DC

## 2017-11-07 NOTE — Progress Notes (Signed)
Office Visit Note   Patient: Arthur James           Date of Birth: 12-21-1970           MRN: [id number] Visit Date: 11/07/2017 Requested by: No referring provider defined for this encounter. PCP: Patient, No Pcp Per  Subjective: Chief Complaint  Patient presents with  . Right Shoulder - Pain    HPI: Patient presents for follow-up of right shoulder.  In general his situation has not changed much.  I thought at last clinic visit he had a little bit more range of motion.  In general patient would like to have better function of his shoulder.  He is here with a Nurse, learning disability.  Requesting stronger pain medicine.              ROS: All systems reviewed are negative as they relate to the chief complaint within the history of present illness.  Patient denies  fevers or chills.   Assessment & Plan: Visit Diagnoses:  1. S/P right rotator cuff repair     Plan: Impression is right shoulder pain following rotator cuff repair and subsequent manipulation under anesthesia with capsular release.  Did not have great results after that even though we did achieve more range of motion.  This is a difficult situation for the patient.  In general I do not think we can regain range of motion without him going through fairly significant amount of pain and rehab.  From his first experience after the shoulder stiffness following manipulation and CPM use I do not think that he really made a great recovery.  Not sure if anything would be different with another capsular release and manipulation.  He did have extensive visible therapy when he was under workman's compensation care.  Currently he has Medicaid and would have very limited physical therapy.  All these things are mitigating factors against further intervention however without intervention I do not think he is going to regain meaningful shoulder motion.  All this is explained to him through a translator.  Last clinic visit he was adamantly opposed to more  surgery.  We have sent him for other specialty evaluations.  Plan at this time is one-time prescription for pain medicine and repeat office visit in 4 months.  He really needs to decide if he wants to go a fair amount of painful rehab in order to get a little bit more function out of his shoulder.  Follow-Up Instructions: Return in about 4 months (around 03/09/2018).   Orders:  No orders of the defined types were placed in this encounter.  Meds ordered this encounter  Medications  . oxyCODONE (ROXICODONE) 5 MG immediate release tablet    Sig: 1-2 po q 12 hrs prn pain    Dispense:  42 tablet    Refill:  0      Procedures: No procedures performed   Clinical Data: No additional findings.  Objective: Vital Signs: There were no vitals taken for this visit.  Physical Exam:  Constitutional: Patient appears well-developed HEENT:  Head: Normocephalic Eyes:EOM are normal Neck: Normal range of motion Cardiovascular: Normal rate Pulmonary/chest: Effort normal Neurologic: Patient is alert Skin: Skin is warm Psychiatric: Patient has normal mood and affect    Ortho Exam: Orthopedic exam demonstrates functional deltoid strength.  Cuff strength is reasonable but is external rotation at 15 degrees of abduction is around 20-30 degrees.  Is really difficult for me to actually get him to relax enough to assess his  passive range of motion but it is around 90 degrees of forward flexion and slightly less than 90 of abduction.  Shoulder does remain stiff but there is no real significant.  Motor sensory function to the hand is intact tenderness no skin temperature difference or color difference between the right shoulder and left shoulder  Specialty Comments:  No specialty comments available.  Imaging: No results found.   PMFS History: Patient Active Problem List   Diagnosis Date Noted  . Supraspinatus tendon tear, left, initial encounter 09/27/2016   History reviewed. No pertinent past  medical history.  History reviewed. No pertinent family history.  History reviewed. No pertinent surgical history. Social History   Occupational History  . Not on file  Tobacco Use  . Smoking status: Current Every Day Smoker    Packs/day: 1.00    Types: Cigarettes  . Smokeless tobacco: Never Used  Substance and Sexual Activity  . Alcohol use: No  . Drug use: No  . Sexual activity: Not on file

## 2017-12-11 ENCOUNTER — Ambulatory Visit (INDEPENDENT_AMBULATORY_CARE_PROVIDER_SITE_OTHER): Payer: Medicaid Other | Admitting: Orthopaedic Surgery

## 2017-12-11 ENCOUNTER — Encounter (INDEPENDENT_AMBULATORY_CARE_PROVIDER_SITE_OTHER): Payer: Self-pay | Admitting: Orthopaedic Surgery

## 2017-12-11 DIAGNOSIS — M79641 Pain in right hand: Secondary | ICD-10-CM

## 2017-12-11 DIAGNOSIS — M79642 Pain in left hand: Secondary | ICD-10-CM

## 2017-12-11 MED ORDER — MELOXICAM 7.5 MG PO TABS: 7.5000 mg | ORAL_TABLET | Freq: Two times a day (BID) | ORAL | 2 refills | Status: DC | PRN

## 2017-12-11 MED ORDER — DICLOFENAC SODIUM 1 % TD GEL: 2.0000 g | Freq: Four times a day (QID) | TRANSDERMAL | 5 refills | Status: DC

## 2017-12-11 NOTE — Progress Notes (Signed)
   Office Visit Note   Patient: Arthur James           Date of Birth: 04/09/1971           MRN: [id number] Visit Date: 12/11/2017              Requested by: No referring provider defined for this encounter. PCP: Patient, No Pcp Per   Assessment & Plan: Visit Diagnoses:  1. Pain in both hands   2. 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  Prescription for Voltaren gel and meloxicam.  In terms of the tremor we will refer him to a neurologist for evaluation.  Today's encounter was facilitated through an interpreter which did add to the complexity of the visit.  Follow-Up Instructions: Return if symptoms worsen or fail to improve.   Orders:  Orders Placed This Encounter  Procedures  . Ambulatory referral to Neurology   Meds ordered this encounter  Medications  . diclofenac sodium (VOLTAREN) 1 % GEL    Sig: Apply 2 g topically 4 (four) times daily.    Dispense:  1 Tube    Refill:  5  . meloxicam (MOBIC) 7.5 MG tablet    Sig: Take 1 tablet (7.5 mg total) by mouth 2 (two) times daily as needed for pain.    Dispense:  30 tablet    Refill:  2      Procedures: No procedures performed   Clinical Data: No additional findings.   Subjective: Chief Complaint  Patient presents with  . Neck - Follow-up, Pain  . Right Shoulder - Follow-up, Pain    Patient is a gentleman who is an established patient of Dr. Diamantina Providence who comes in with bilateral hand numbness and pain in 1 week history of new development of tremors of his right upper extremity.  Denies any injuries.  He presents today with his interpreter   Review of Systems  Constitutional: Negative.   All other systems reviewed and are negative.    Objective: Vital Signs: There were no vitals taken for this visit.  Physical Exam  Constitutional: He is oriented to person, place, and time. He appears well-developed and well-nourished.  Pulmonary/Chest: Effort  normal.  Abdominal: Soft.  Neurological: He is alert and oriented to person, place, and time.  Skin: Skin is warm.  Psychiatric: He has a normal mood and affect. His behavior is normal. Judgment and thought content normal.  Nursing note and vitals reviewed.   Ortho Exam Right hand exam shows painful DIP joints.  There is no triggering.  Negative carpal tunnel compressive signs.  He does have a resting tremor of the right hand. Specialty Comments:  No specialty comments available.  Imaging: No results found.   PMFS History: Patient Active Problem List   Diagnosis Date Noted  . Supraspinatus tendon tear, left, initial encounter 09/27/2016   No past medical history on file.  No family history on file.  No past surgical history on file. Social History   Occupational History  . Not on file  Tobacco Use  . Smoking status: Current Every Day Smoker    Packs/day: 1.00    Types: Cigarettes  . Smokeless tobacco: Never Used  Substance and Sexual Activity  . Alcohol use: No  . Drug use: No  . Sexual activity: Not on file

## 2018-03-15 ENCOUNTER — Telehealth (INDEPENDENT_AMBULATORY_CARE_PROVIDER_SITE_OTHER): Payer: Self-pay | Admitting: Orthopedic Surgery

## 2018-03-15 NOTE — Telephone Encounter (Signed)
Please advise. Patient last seen by you in April 07/17 #40 Norco from Dr Ollen BowlHarkins 06/12 #40 Norco from Dr Ollen BowlHarkins 05/30 #28 ultram from Dr Ollen BowlHarkins 05/28 #28 ultram from Dr Ollen BowlHarkins

## 2018-03-15 NOTE — Telephone Encounter (Signed)
Patient's daughter called stating that his arm is hurting and his has no pain medication and is requesting that pain medication be called in for him.  CB#[phone].  Thank you.

## 2018-03-20 NOTE — Telephone Encounter (Signed)
Try harkins first

## 2018-03-20 NOTE — Telephone Encounter (Signed)
IC LM advising should continue to get rx from Dr Ollen BowlHarkins.

## 2018-04-23 ENCOUNTER — Encounter (HOSPITAL_COMMUNITY): Payer: Self-pay | Admitting: *Deleted

## 2018-04-23 ENCOUNTER — Emergency Department (HOSPITAL_COMMUNITY): Payer: Medicaid Other

## 2018-04-23 ENCOUNTER — Other Ambulatory Visit: Payer: Self-pay

## 2018-04-23 ENCOUNTER — Emergency Department (HOSPITAL_COMMUNITY)
Admission: EM | Admit: 2018-04-23 | Discharge: 2018-04-23 | Disposition: A | Discharge status: Home / Self Care | Payer: Medicaid Other | Attending: Emergency Medicine | Admitting: Emergency Medicine

## 2018-04-23 DIAGNOSIS — M549 Dorsalgia, unspecified: Secondary | ICD-10-CM | POA: Insufficient documentation

## 2018-04-23 DIAGNOSIS — Y939 Activity, unspecified: Secondary | ICD-10-CM | POA: Diagnosis not present

## 2018-04-23 DIAGNOSIS — M25511 Pain in right shoulder: Secondary | ICD-10-CM | POA: Insufficient documentation

## 2018-04-23 DIAGNOSIS — R251 Tremor, unspecified: Secondary | ICD-10-CM | POA: Diagnosis not present

## 2018-04-23 DIAGNOSIS — Y999 Unspecified external cause status: Secondary | ICD-10-CM | POA: Diagnosis not present

## 2018-04-23 DIAGNOSIS — M545 Low back pain: Secondary | ICD-10-CM | POA: Diagnosis not present

## 2018-04-23 DIAGNOSIS — Y929 Unspecified place or not applicable: Secondary | ICD-10-CM | POA: Diagnosis not present

## 2018-04-23 DIAGNOSIS — R531 Weakness: Secondary | ICD-10-CM | POA: Insufficient documentation

## 2018-04-23 DIAGNOSIS — F17210 Nicotine dependence, cigarettes, uncomplicated: Secondary | ICD-10-CM | POA: Diagnosis not present

## 2018-04-23 DIAGNOSIS — M542 Cervicalgia: Secondary | ICD-10-CM | POA: Diagnosis not present

## 2018-04-23 MED ORDER — METHOCARBAMOL 500 MG PO TABS: 500.0000 mg | ORAL_TABLET | Freq: Two times a day (BID) | ORAL | 0 refills | Status: DC

## 2018-04-23 MED ORDER — TRAMADOL HCL 50 MG PO TABS: 50.0000 mg | ORAL_TABLET | Freq: Four times a day (QID) | ORAL | 0 refills | Status: DC | PRN

## 2018-04-23 MED ORDER — OXYCODONE-ACETAMINOPHEN 5-325 MG PO TABS
2.0000 | ORAL_TABLET | Freq: Once | ORAL | Status: AC
  Administered 2018-04-23: 2 via ORAL
  Filled 2018-04-23: qty 2

## 2018-04-23 MED ORDER — LIDOCAINE 5 % EX PTCH: 1.0000 | MEDICATED_PATCH | CUTANEOUS | 0 refills | Status: DC

## 2018-04-23 NOTE — ED Notes (Signed)
Patient was ambulatory with no assisted. Pt discharged per P.A order.
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
## 2018-04-23 NOTE — Discharge Instructions (Addendum)
Expect your soreness to increase over the next 2-3 days. Take it easy, but do not lay around too much as this may make any stiffness worse.  Antiinflammatory medications: Take 600 mg of ibuprofen every 6 hours or 440 mg (over the counter dose) to 500 mg (prescription dose) of naproxen every 12 hours for the next 3 days. After this time, these medications may be used as needed for pain. Take these medications with food to avoid upset stomach. Choose only one of these medications, do not take them together. Acetaminophen (generic for Tylenol): Should you continue to have additional pain while taking the ibuprofen or naproxen, you may add in acetaminophen as needed. Your daily total maximum amount of acetaminophen from all sources should be limited to 4000mg /day for persons without liver problems, or 2000mg /day for those with liver problems. Muscle relaxer: Robaxin is a muscle relaxer and may help loosen stiff muscles. Do not take the Robaxin while driving or performing other dangerous activities.  Lidocaine patches: These are available via either prescription or over-the-counter. The over-the-counter option may be more economical one and are likely just as effective. There are multiple over-the-counter brands, such as Salonpas. Exercises: Be sure to perform the attached exercises starting with three times a week and working up to performing them daily. This is an essential part of preventing long term problems.  Follow up: Follow up with your orthopedic specialist on this matter.  Be sure to follow up within 7-10 days. Return: Return to the ED should symptoms worsen.  For prescription assistance, may try using prescription discount sites or apps, such as goodrx.com
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
## 2018-04-23 NOTE — ED Notes (Signed)
Patient transported to CT 
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
## 2018-06-14 ENCOUNTER — Ambulatory Visit: Payer: Medicaid Other | Attending: Family Medicine | Admitting: Physical Therapy

## 2018-08-26 ENCOUNTER — Ambulatory Visit: Payer: Medicaid Other | Admitting: Neurology

## 2018-10-14 ENCOUNTER — Telehealth: Payer: Self-pay | Admitting: Neurology

## 2018-10-14 ENCOUNTER — Ambulatory Visit: Payer: Medicaid Other | Admitting: Neurology

## 2018-10-14 NOTE — Telephone Encounter (Signed)
Fail to reach patient by phone, chronic opioid management patient, was referred to neck pain  CT head was normal in Sept 2019. CT cervical showed no acute abnormalities.  Will reschedule patient later.

## 2018-10-14 NOTE — Telephone Encounter (Signed)
Patient called back and rescheduled for 12/09/2018.

## 2018-12-09 ENCOUNTER — Ambulatory Visit: Payer: Medicaid Other | Admitting: Neurology

## 2018-12-16 ENCOUNTER — Ambulatory Visit (INDEPENDENT_AMBULATORY_CARE_PROVIDER_SITE_OTHER): Payer: Medicaid Other | Admitting: Neurology

## 2018-12-16 ENCOUNTER — Other Ambulatory Visit: Payer: Self-pay

## 2018-12-16 ENCOUNTER — Encounter

## 2018-12-16 ENCOUNTER — Encounter: Payer: Self-pay | Admitting: Neurology

## 2018-12-16 VITALS — BP 145/90 | HR 94 | Temp 97.8°F | Wt 166.0 lb

## 2018-12-16 DIAGNOSIS — R251 Tremor, unspecified: Secondary | ICD-10-CM | POA: Diagnosis not present

## 2018-12-16 DIAGNOSIS — M542 Cervicalgia: Secondary | ICD-10-CM

## 2018-12-16 DIAGNOSIS — M6281 Muscle weakness (generalized): Secondary | ICD-10-CM | POA: Diagnosis not present

## 2018-12-16 DIAGNOSIS — R29898 Other symptoms and signs involving the musculoskeletal system: Secondary | ICD-10-CM

## 2018-12-16 MED ORDER — DULOXETINE HCL 60 MG PO CPEP: 60.0000 mg | ORAL_CAPSULE | Freq: Every day | ORAL | 12 refills | Status: DC
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
## 2018-12-17 ENCOUNTER — Telehealth: Payer: Self-pay | Admitting: Neurology

## 2018-12-17 LAB — VITAMIN B12: Vitamin B-12: 341 pg/mL (ref 232–1245)

## 2018-12-17 LAB — CBC WITH DIFFERENTIAL
Basophils Absolute: 0 10*3/uL (ref 0.0–0.2)
Basos: 1 %
EOS (ABSOLUTE): 0.2 10*3/uL (ref 0.0–0.4)
Eos: 2 %
Hematocrit: 42.3 % (ref 37.5–51.0)
Hemoglobin: 15 g/dL (ref 13.0–17.7)
Immature Grans (Abs): 0 10*3/uL (ref 0.0–0.1)
Immature Granulocytes: 0 %
Lymphocytes Absolute: 2.7 10*3/uL (ref 0.7–3.1)
Lymphs: 36 %
MCH: 30.8 pg (ref 26.6–33.0)
MCHC: 35.5 g/dL (ref 31.5–35.7)
MCV: 87 fL (ref 79–97)
Monocytes Absolute: 0.8 10*3/uL (ref 0.1–0.9)
Monocytes: 11 %
Neutrophils Absolute: 3.8 10*3/uL (ref 1.4–7.0)
Neutrophils: 50 %
RBC: 4.87 x10E6/uL (ref 4.14–5.80)
RDW: 12.8 % (ref 11.6–15.4)
WBC: 7.6 10*3/uL (ref 3.4–10.8)

## 2018-12-17 LAB — TSH: TSH: 1.79 u[IU]/mL (ref 0.450–4.500)

## 2018-12-17 LAB — COMPREHENSIVE METABOLIC PANEL
ALT: 41 IU/L (ref 0–44)
AST: 27 IU/L (ref 0–40)
Albumin/Globulin Ratio: 1.6 (ref 1.2–2.2)
Albumin: 4.4 g/dL (ref 4.0–5.0)
Alkaline Phosphatase: 59 IU/L (ref 39–117)
BUN/Creatinine Ratio: 18 (ref 9–20)
BUN: 12 mg/dL (ref 6–24)
Bilirubin Total: 0.2 mg/dL (ref 0.0–1.2)
CO2: 18 mmol/L — ABNORMAL LOW (ref 20–29)
Calcium: 8.8 mg/dL (ref 8.7–10.2)
Chloride: 105 mmol/L (ref 96–106)
Creatinine, Ser: 0.67 mg/dL — ABNORMAL LOW (ref 0.76–1.27)
GFR calc Af Amer: 133 mL/min/{1.73_m2} (ref >59)
GFR calc non Af Amer: 115 mL/min/{1.73_m2} (ref >59)
Globulin, Total: 2.8 g/dL (ref 1.5–4.5)
Glucose: 93 mg/dL (ref 65–99)
Potassium: 4.4 mmol/L (ref 3.5–5.2)
Sodium: 137 mmol/L (ref 134–144)
Total Protein: 7.2 g/dL (ref 6.0–8.5)

## 2018-12-17 LAB — ANA W/REFLEX: Anti Nuclear Antibody (ANA): NEGATIVE

## 2018-12-17 LAB — C-REACTIVE PROTEIN: CRP: 29 mg/L — ABNORMAL HIGH (ref 0–10)

## 2018-12-17 LAB — SEDIMENTATION RATE: Sed Rate: 48 mm/hr — ABNORMAL HIGH (ref 0–15)

## 2018-12-17 LAB — RPR: RPR Ser Ql: NONREACTIVE

## 2018-12-17 LAB — CK: Total CK: 110 U/L (ref 49–439)

## 2018-12-17 LAB — VITAMIN D 25 HYDROXY (VIT D DEFICIENCY, FRACTURES): Vit D, 25-Hydroxy: 10.2 ng/mL — ABNORMAL LOW (ref 30.0–100.0)

## 2018-12-17 LAB — HIV ANTIBODY (ROUTINE TESTING W REFLEX): HIV Screen 4th Generation wRfx: NONREACTIVE

## 2018-12-17 NOTE — Telephone Encounter (Signed)
I contacted the language line at [phone].  They called the patient and was unable to reach him.  They could not leave a message because his voicemail was not set up.  They also reached out to his contact on DPR and let brief message that we needed him to call us back.

## 2018-12-17 NOTE — Telephone Encounter (Signed)
Medicaid order sent to GI. They will obtain the auth and reach out to the pt to schedule.  °

## 2018-12-17 NOTE — Telephone Encounter (Signed)
Please call patient, lab showed elevated ESR, CRP, indicate systemic inflammatory process.   He may ask his PCP refer him to rheumatologist for further evaluation of his joint pain and swelling,  Evidence of vitamin D deficiency, level was 10, he should start vitamin D3 supplement 1000 units 2 tablets daily  Normal CMP, CBC

## 2018-12-18 NOTE — Telephone Encounter (Signed)
I called the patient again by using the language line.  He was provided his results and given an opportunity to ask questions.  He verbalized understanding and is in agreement with the plan below.

## 2019-01-13 ENCOUNTER — Other Ambulatory Visit: Payer: Medicaid Other

## 2019-01-13 ENCOUNTER — Inpatient Hospital Stay: Admission: RE | Admit: 2019-01-13 | Payer: Medicaid Other | Source: Ambulatory Visit

## 2019-01-22 NOTE — Telephone Encounter (Signed)
Medicaid Josem Kaufmann: [id number] (exp. 01/10/19 to 07/09/19) patient scheduled at [facility] for 02/04/19.

## 2019-02-04 ENCOUNTER — Other Ambulatory Visit: Payer: Medicaid Other

## 2019-02-17 ENCOUNTER — Ambulatory Visit: Payer: Medicaid Other | Admitting: Neurology

## 2019-02-18 ENCOUNTER — Encounter: Payer: Self-pay | Admitting: Neurology

## 2019-03-03 ENCOUNTER — Other Ambulatory Visit: Payer: Self-pay

## 2019-03-03 ENCOUNTER — Telehealth: Payer: Self-pay | Admitting: Neurology

## 2019-03-03 ENCOUNTER — Ambulatory Visit
Admission: RE | Admit: 2019-03-03 | Discharge: 2019-03-03 | Disposition: A | Discharge status: Home / Self Care | Payer: Medicaid Other | Source: Ambulatory Visit | Attending: Neurology | Admitting: Neurology

## 2019-03-03 DIAGNOSIS — M6281 Muscle weakness (generalized): Secondary | ICD-10-CM | POA: Diagnosis not present

## 2019-03-03 NOTE — Telephone Encounter (Signed)
error 

## 2019-03-03 NOTE — Telephone Encounter (Signed)
Please call patient, MRI cervical spine showed multi-level degenerative changes, moderate right foraminal narrowing at C4-5, C5-6 level, spinal cord was normal. No definite interval changes compared to previous scan in July 2018  MRI brain was normal.  I ordered EMG/NCS, please cancel his appt with NP Sarah   IMPRESSION: This MRI of the cervical spine shows the following: 1.    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 2.   At C4-C5, there is moderate right foraminal narrowing that could affect the right C5 nerve root.  At C5-C6, there is moderate right greater than left foraminal narrowing that could affect the C6 nerve roots. 3.   Spinal cord has normal signal. 4.  Compared to the MRI dated 02/14/2017, there is no definite interval change.  IMPRESSION: This is a normal noncontrasted MRI of the brain.

## 2019-03-04 ENCOUNTER — Encounter: Payer: Self-pay | Admitting: Neurology

## 2019-03-04 NOTE — Telephone Encounter (Signed)
I called the patient and advised the daughter who speaks english of the results. Advised that I was caneling the patient's apt for 8/18 and that I will have someone reach out to schedule for the NCV and EEG and then follow up with Sarah after that study has been completed. She verbalized understanding.

## 2019-03-05 NOTE — Telephone Encounter (Signed)
I have called patient and scheduled a NCV/EMG and also a follow-up with Sarah after the test. I have sent the patient appointment info via mail.

## 2019-03-11 NOTE — Progress Notes (Deleted)
PATIENT: Arthur James DOB: May 15, 1971  REASON FOR VISIT: follow up HISTORY FROM: patient  HISTORY OF PRESENT ILLNESS: Today 03/11/19  HISTORY  Arthur James is a 48 years old male,  seen in request by his primary care at [place], [place]  I have reviewed and summarized the referring note from the referring physician.  He is with interpreter at today's interview.  He is a native of [place], speaks Arabi, history is through interpreter,  He was previously healthy, worked at [employer] job, had heavy object fell on his right shoulder in February 2018, since then, he had a chronic neck pain, limited range of motion of right shoulder, gradually getting worse, in 2019, he also developed right worse than left hand tremor, head titubation, which has been persistent since then, unsteady gait  He also complains of bilateral hands joints pain, swelling, deformity  Laboratory evaluation in June 2018 showed UDS was negative, elevated C-reactive protein 8.6, ESR 4, normal CBC hemoglobin of 16.3,  I personally reviewed CT head wo that was normal.  CT cervical: multiple level degenerative changes, no acute abnormalities.  Update March 12, 2019 SS: Laboratory evaluation May 2020, elevated ESR, CRP, PCP to refer to rheumatologist to evaluate joint pain and swelling, low vitamin D, started on oral supplementation.  MRI of cervical spine showed multilevel degenerative changes, moderate right foraminal narrowing at C4-5, C5-6 level, spinal cord was normal.  No definite interval changes compared to previous scan in July 2018.  MRI of the brain was normal.  REVIEW OF SYSTEMS: Out of a complete 14 system review of symptoms, the patient complains only of the following symptoms, and all other reviewed systems are negative.  ALLERGIES: No Known Allergies  HOME MEDICATIONS: Outpatient Medications Prior to Visit  Medication Sig Dispense Refill  . DULoxetine (CYMBALTA) 60 MG capsule Take  1 capsule (60 mg total) by mouth daily. 30 capsule 12  . oxyCODONE-acetaminophen (PERCOCET) 10-325 MG tablet TK 1 T PO QID PRN    . pregabalin (LYRICA) 200 MG capsule TK 1 C PO BID     No facility-administered medications prior to visit.     PAST MEDICAL HISTORY: Past Medical History:  Diagnosis Date  . Neck pain   . Tremor    right hand     PAST SURGICAL HISTORY: Past Surgical History:  Procedure Laterality Date  . SHOULDER SURGERY Right     FAMILY HISTORY: No family history on file.  SOCIAL HISTORY: Social History   Socioeconomic History  . Marital status: Married    Spouse name: Not on file  . Number of children: Not on file  . Years of education: Not on file  . Highest education level: Not on file  Occupational History  . Not on file  Social Needs  . Financial resource strain: Not on file  . Food insecurity    Worry: Not on file    Inability: Not on file  . Transportation needs    Medical: Not on file    Non-medical: Not on file  Tobacco Use  . Smoking status: Current Every Day Smoker    Packs/day: 1.00    Types: Cigarettes  . Smokeless tobacco: Never Used  Substance and Sexual Activity  . Alcohol use: No  . Drug use: No  . Sexual activity: Not on file  Lifestyle  . Physical activity    Days per week: Not on file    Minutes per session: Not on file  . Stress: Not  on file  Relationships  . Social Herbalist on phone: Not on file    Gets together: Not on file    Attends religious service: Not on file    Active member of club or organization: Not on file    Attends meetings of clubs or organizations: Not on file    Relationship status: Not on file  . Intimate partner violence    Fear of current or ex partner: Not on file    Emotionally abused: Not on file    Physically abused: Not on file    Forced sexual activity: Not on file  Other Topics Concern  . Not on file  Social History Narrative   Right handed    Caffeine " states a lot  throughout the day"      PHYSICAL EXAM  There were no vitals filed for this visit. There is no height or weight on file to calculate BMI.  Generalized: Well developed, in no acute distress   Neurological examination  Mentation: Alert oriented to time, place, history taking. Follows all commands speech and language fluent Cranial nerve II-XII: Pupils were equal round reactive to light. Extraocular movements were full, visual field were full on confrontational test. Facial sensation and strength were normal. Uvula tongue midline. Head turning and shoulder shrug  were normal and symmetric. Motor: The motor testing reveals 5 over 5 strength of all 4 extremities. Good symmetric motor tone is noted throughout.  Sensory: Sensory testing is intact to soft touch on all 4 extremities. No evidence of extinction is noted.  Coordination: Cerebellar testing reveals good finger-nose-finger and heel-to-shin bilaterally.  Gait and station: Gait is normal. Tandem gait is normal. Romberg is negative. No drift is seen.  Reflexes: Deep tendon reflexes are symmetric and normal bilaterally.   DIAGNOSTIC DATA (LABS, IMAGING, TESTING) - I reviewed patient records, labs, notes, testing and imaging myself where available.  Lab Results  Component Value Date   WBC 7.6 12/16/2018   HGB 15.0 12/16/2018   HCT 42.3 12/16/2018   MCV 87 12/16/2018   PLT 246 01/26/2017      Component Value Date/Time   NA 137 12/16/2018 1700   K 4.4 12/16/2018 1700   CL 105 12/16/2018 1700   CO2 18 (L) 12/16/2018 1700   GLUCOSE 93 12/16/2018 1700   GLUCOSE 116 (H) 10/13/2015 1500   BUN 12 12/16/2018 1700   CREATININE 0.67 (L) 12/16/2018 1700   CALCIUM 8.8 12/16/2018 1700   PROT 7.2 12/16/2018 1700   ALBUMIN 4.4 12/16/2018 1700   AST 27 12/16/2018 1700   ALT 41 12/16/2018 1700   ALKPHOS 59 12/16/2018 1700   BILITOT 0.2 12/16/2018 1700   GFRNONAA 115 12/16/2018 1700   GFRAA 133 12/16/2018 1700   No results found for:  CHOL, HDL, LDLCALC, LDLDIRECT, TRIG, CHOLHDL No results found for: HGBA1C Lab Results  Component Value Date   VITAMINB12 341 12/16/2018   Lab Results  Component Value Date   TSH 1.790 12/16/2018      ASSESSMENT AND PLAN 48 y.o. year old male  has a past medical history of Neck pain and Tremor. here with:  1.  Right shoulder pain 2.  Neck pain 3.  Gait abnormality 4.  Tremor   I spent 15 minutes with the patient. 50% of this time was spent   Butler Denmark, King, DNP 03/11/2019, 9:37 PM [facility] [address] [place], [place] [address] [phone]

## 2019-03-12 ENCOUNTER — Telehealth: Payer: Self-pay | Admitting: *Deleted

## 2019-03-12 ENCOUNTER — Ambulatory Visit: Payer: Medicaid Other | Admitting: Neurology

## 2019-03-12 ENCOUNTER — Other Ambulatory Visit: Payer: Self-pay

## 2019-03-12 ENCOUNTER — Encounter: Payer: Self-pay | Admitting: Neurology

## 2019-03-12 VITALS — BP 130/80 | HR 95 | Temp 97.3°F | Ht 68.0 in | Wt 159.6 lb

## 2019-03-12 DIAGNOSIS — R251 Tremor, unspecified: Secondary | ICD-10-CM | POA: Diagnosis not present

## 2019-03-12 DIAGNOSIS — M542 Cervicalgia: Secondary | ICD-10-CM | POA: Diagnosis not present

## 2019-03-12 NOTE — Progress Notes (Signed)
PATIENT: Arthur James DOB: 01-05-1971  REASON FOR VISIT: follow up HISTORY FROM: patient  HISTORY OF PRESENT ILLNESS: Today 03/12/19  HISTORY  Arthur James is a 48 years old male,  seen in request by his primary care at [place], [place]  I have reviewed and summarized the referring note from the referring physician.  He is with interpreter at today's interview.  He is a native of [place], speaks Arabi, history is through interpreter,  He was previously healthy, worked at [employer] job, had heavy object fell on his right shoulder in February 2018, since then, he had a chronic neck pain, limited range of motion of right shoulder, gradually getting worse, in 2019, he also developed right worse than left hand tremor, head titubation, which has been persistent since then, unsteady gait  He also complains of bilateral hands joints pain, swelling, deformity  Laboratory evaluation in June 2018 showed UDS was negative, elevated C-reactive protein 8.6, ESR 4, normal CBC hemoglobin of 16.3,  I personally reviewed CT head wo that was normal.  CT cervical: multiple level degenerative changes, no acute abnormalities.  Update March 12, 2019 SS: Laboratory evaluation May 2020 showed elevated ESR, CRP, PCP referred to rheumatology for further evaluation of joint pain and swelling, evidence of vitamin D deficiency, level was 10, started on oral vitamin D supplementation  MRI of cervical spine 03/03/2019 showed multilevel degenerative changes, moderate right foraminal narrowing at C4-5, C5-6 level, spinal cord was normal.  No definitive interval changes compared to previous in July 2018.  MRI of the brain was normal  He wants to send his results to get social security, and Psychologist, counselling. He has appointment on 8/26 with a judge.   Today, he is getting worse, he has pain to his head, down his right arm and leg for 6 months. He doesn't know his medications. Tremor to head, right hand x  1 year. He isn't sure of his medications.  He brought his own [place] interpreter.  REVIEW OF SYSTEMS: Out of a complete 14 system review of symptoms, the patient complains only of the following symptoms, and all other reviewed systems are negative.  Tremor, neck pain,   ALLERGIES: No Known Allergies  HOME MEDICATIONS: Outpatient Medications Prior to Visit  Medication Sig Dispense Refill  . DULoxetine (CYMBALTA) 60 MG capsule Take 1 capsule (60 mg total) by mouth daily. 30 capsule 12  . oxyCODONE-acetaminophen (PERCOCET) 10-325 MG tablet TK 1 T PO QID PRN    . pregabalin (LYRICA) 200 MG capsule TK 1 C PO BID     No facility-administered medications prior to visit.     PAST MEDICAL HISTORY: Past Medical History:  Diagnosis Date  . Neck pain   . Tremor    right hand     PAST SURGICAL HISTORY: Past Surgical History:  Procedure Laterality Date  . SHOULDER SURGERY Right     FAMILY HISTORY: No family history on file.  SOCIAL HISTORY: Social History   Socioeconomic History  . Marital status: Married    Spouse name: Not on file  . Number of children: Not on file  . Years of education: Not on file  . Highest education level: Not on file  Occupational History  . Not on file  Social Needs  . Financial resource strain: Not on file  . Food insecurity    Worry: Not on file    Inability: Not on file  . Transportation needs    Medical: Not on file  Non-medical: Not on file  Tobacco Use  . Smoking status: Current Every Day Smoker    Packs/day: 1.00    Types: Cigarettes  . Smokeless tobacco: Never Used  Substance and Sexual Activity  . Alcohol use: No  . Drug use: No  . Sexual activity: Not on file  Lifestyle  . Physical activity    Days per week: Not on file    Minutes per session: Not on file  . Stress: Not on file  Relationships  . Social Herbalist on phone: Not on file    Gets together: Not on file    Attends religious service: Not on file     Active member of club or organization: Not on file    Attends meetings of clubs or organizations: Not on file    Relationship status: Not on file  . Intimate partner violence    Fear of current or ex partner: Not on file    Emotionally abused: Not on file    Physically abused: Not on file    Forced sexual activity: Not on file  Other Topics Concern  . Not on file  Social History Narrative   Right handed    Caffeine " states a lot throughout the day"    PHYSICAL EXAM  Vitals:   03/12/19 1431  BP: 130/80  Pulse: 95  Temp: (!) 97.3 F (36.3 C)  Weight: 159 lb 9.6 oz (72.4 kg)  Height: '5\' 8"'  (1.727 m)   Body mass index is 24.27 kg/m.  Generalized: Well developed, in no acute distress   Neurological examination  Mentation: Alert oriented to time, place, history taking. Follows all commands speech and language fluent Cranial nerve II-XII: Pupils were equal round reactive to light. Extraocular movements were full, visual field were full on confrontational test. Facial sensation and strength were normal.  Head turning and shoulder shrug were normal and symmetric. Motor: The motor testing reveals 5 over 5 strength of all 4 extremities. Good symmetric motor tone is noted throughout.  Head titubation, resting tremor right hand Sensory: Sensory testing is intact to soft touch on all 4 extremities. No evidence of extinction is noted.  Coordination: Cerebellar testing reveals good finger-nose-finger and heel-to-shin bilaterally.  Gait and station: Gait is mildly unsteady, noted right hand tremor Reflexes: Deep tendon reflexes are symmetric and normal bilaterally.   DIAGNOSTIC DATA (LABS, IMAGING, TESTING) - I reviewed patient records, labs, notes, testing and imaging myself where available.  Lab Results  Component Value Date   WBC 7.6 12/16/2018   HGB 15.0 12/16/2018   HCT 42.3 12/16/2018   MCV 87 12/16/2018   PLT 246 01/26/2017      Component Value Date/Time   NA 137  12/16/2018 1700   K 4.4 12/16/2018 1700   CL 105 12/16/2018 1700   CO2 18 (L) 12/16/2018 1700   GLUCOSE 93 12/16/2018 1700   GLUCOSE 116 (H) 10/13/2015 1500   BUN 12 12/16/2018 1700   CREATININE 0.67 (L) 12/16/2018 1700   CALCIUM 8.8 12/16/2018 1700   PROT 7.2 12/16/2018 1700   ALBUMIN 4.4 12/16/2018 1700   AST 27 12/16/2018 1700   ALT 41 12/16/2018 1700   ALKPHOS 59 12/16/2018 1700   BILITOT 0.2 12/16/2018 1700   GFRNONAA 115 12/16/2018 1700   GFRAA 133 12/16/2018 1700   No results found for: CHOL, HDL, LDLCALC, LDLDIRECT, TRIG, CHOLHDL No results found for: HGBA1C Lab Results  Component Value Date   VITAMINB12 341 12/16/2018  Lab Results  Component Value Date   TSH 1.790 12/16/2018    ASSESSMENT AND PLAN 48 y.o. year old male  has a past medical history of Neck pain and Tremor. here with:  1.  Right shoulder pain 2.  Neck pain 3.  Gait abnormality 4.  Tremor -Laboratory evaluation revealed elevated ESR, CRP, indicating systemic inflammatory process, PCP was to place referral to rheumatology, language barrier, not sure if this was done, I will place a referral today -Vitamin D deficient, started on oral vitamin D supplementation -MRI of the cervical spine showed multilevel degenerative changes, moderate right foraminal narrowing at C4-5, C5-6 level, spinal cord was normal.  No definitive interval changes compared to previous scan in July 2018 -MRI of the brain was normal -He is scheduled for EMG nerve conduction in October 2020 with Dr. Krista Blue -I am unclear what medications he is actually taking, Dr. Krista Blue has prescribed Cymbalta, he will find out what his medications are and call our office in regards to pain -He will follow-up in 6 months or sooner if needed -He is going to request his medical records, is trying to get Social Security  I spent 15 minutes with the patient. 50% of this time was spent discussing his plan of care.   Butler Denmark, AGNP-C, DNP 03/12/2019, 2:48  PM [facility] [address], [place], [place] [address] [phone]

## 2019-03-12 NOTE — Patient Instructions (Signed)
Call and let us know what medications you are taking. You are scheduled for Nerve conduction study in October. You can request your records from here for your meeting. I will place a referral for rheumatology.

## 2019-03-12 NOTE — Telephone Encounter (Signed)
I called pt, then called back with interpreter, [place] ID [phone], explained that per Dr. Krista Blue that he needed to have [place]/EMG testing done prior to being seen again.  Pt stated that he wanted to be seen for his pain, forms (disability).  I asked if he had seen rheumatologist from last note in 5/20 and he had not.  I relayed that will go ahead and see him, to make sure he understands plan of care, hard since language barrier.  He was at times did not want to go into more information.  I relayed that [place]/EMG scheduled in 05/2019 and  Could be placed on cancellation list.

## 2019-03-12 NOTE — Progress Notes (Signed)
I have reviewed and agreed above plan. 

## 2019-03-12 NOTE — Telephone Encounter (Signed)
Pt has been added to wait list.

## 2019-03-18 ENCOUNTER — Ambulatory Visit: Payer: Medicaid Other | Admitting: Neurology

## 2019-03-18 ENCOUNTER — Encounter

## 2019-03-27 ENCOUNTER — Telehealth: Payer: Self-pay | Admitting: *Deleted

## 2019-03-27 NOTE — Telephone Encounter (Signed)
Referral placed for rheumatology (this was declined by Dr. Estanislado Pandy).  She recommended orthopedics referral.  Is this ok?

## 2019-03-27 NOTE — Telephone Encounter (Signed)
Please ask his PCP to refer.

## 2019-03-31 NOTE — Telephone Encounter (Signed)
I called pt thru [place], [place].  Was not able to LM VM not set up.  Will try later.  Will attempt to send mychart message as well.

## 2019-04-01 NOTE — Telephone Encounter (Signed)
I called pt with arabic interpreter, [place] ID [phone] ([employer]).  Relayed to pt that he needs to get referral from his pcp([facility]) for orthopeds referral (as rheumatology referral MD recommended this vs her).  He verbalized understanding. Will contact his pcp.

## 2019-05-09 ENCOUNTER — Other Ambulatory Visit: Payer: Self-pay

## 2019-05-09 ENCOUNTER — Ambulatory Visit (INDEPENDENT_AMBULATORY_CARE_PROVIDER_SITE_OTHER): Payer: Medicaid Other | Admitting: Orthopaedic Surgery

## 2019-05-09 ENCOUNTER — Encounter: Payer: Self-pay | Admitting: Orthopaedic Surgery

## 2019-05-09 DIAGNOSIS — G8929 Other chronic pain: Secondary | ICD-10-CM | POA: Diagnosis not present

## 2019-05-09 DIAGNOSIS — M25511 Pain in right shoulder: Secondary | ICD-10-CM

## 2019-05-09 DIAGNOSIS — M542 Cervicalgia: Secondary | ICD-10-CM

## 2019-05-09 MED ORDER — PREDNISONE 10 MG (21) PO TBPK: ORAL_TABLET | ORAL | 0 refills | Status: DC

## 2019-05-09 NOTE — Progress Notes (Signed)
Office Visit Note   Patient: Arthur James           Date of Birth: 1970-12-23           MRN: [id number] Visit Date: 05/09/2019              Requested by: [facility], [facility] [address] [place],  [place] [id number] PCP: [facility], [facility]   Assessment & Plan: Visit Diagnoses:  1. Chronic right shoulder pain   2. Neck pain     Plan: Impression is chronic right shoulder and neck pain.  We will refer the patient back to Dr. August Saucer for his shoulder.  In regards to the neck, I will refer the patient to Dr. Alvester Morin for an ESI.  He will follow-up with [place] as needed.  This was all discussed through an interpreter.  Follow-Up Instructions: Return if symptoms worsen or fail to improve.   Orders:  No orders of the defined types were placed in this encounter.  No orders of the defined types were placed in this encounter.     Procedures: No procedures performed   Clinical Data: No additional findings.   Subjective: Chief Complaint  Patient presents with   Neck - Pain   Right Shoulder - Pain    HPI patient is a 48 year old gentleman who comes in today with an interpreter.  He is here with continued right shoulder and neck pain.  This is been ongoing following an accident when he fell onto a pile of lumbar at work a few years back.  He underwent right shoulder rotator cuff repair by Dr. August Saucer.  He then had subsequent right knee manipulation and capsular release.  He never really recovered from the surgery.  He has had continued pain to the right shoulder and into the neck with associated tremors down into his hands.  He has been worked up for CRPS as well as for his tremors by a neurologist.  He has had an ESI few years ago without relief of symptoms.  The pain he has today is to the posterior neck radiating into the entire right shoulder right chest and into the deltoid.  He describes this as a constant pain with associated fatigue.  He has had a recent MRI of the cervical  spine ordered by his neurologist which showed a moderate right foraminal narrowing at C4-5 and C5-6.  Review of Systems as detailed in HPI.  All others reviewed and are negative.   Objective: Vital Signs: There were no vitals taken for this visit.  Physical Exam well-developed and well-nourished gentleman in no acute distress.  Alert and oriented x3.  Ortho Exam examination of his right upper extremity: I am unable to get the patient to move his elbow from his chest secondary to pain.  He does have an active tremor to the right hand.  He has full sensation distally.  He has moderate tenderness to his entire right upper extremity and into his neck region.  Limited range of motion in all planes of the cervical spine.  Specialty Comments:  No specialty comments available.  Imaging: No new imaging   PMFS History: Patient Active Problem List   Diagnosis Date Noted   Tremor 12/16/2018   Neck pain 12/16/2018   Arm weakness 12/16/2018   Supraspinatus tendon tear, left, initial encounter 09/27/2016   Past Medical History:  Diagnosis Date   Neck pain    Tremor    right hand     History reviewed.  No pertinent family history.  Past Surgical History:  Procedure Laterality Date   SHOULDER SURGERY Right    Social History   Occupational History   Not on file  Tobacco Use   Smoking status: Current Every Day Smoker    Packs/day: 1.00    Types: Cigarettes   Smokeless tobacco: Never Used  Substance and Sexual Activity   Alcohol use: No   Drug use: No   Sexual activity: Not on file

## 2019-05-12 ENCOUNTER — Encounter: Payer: Medicaid Other | Admitting: Neurology

## 2019-05-12 ENCOUNTER — Encounter

## 2019-05-12 ENCOUNTER — Telehealth: Payer: Self-pay | Admitting: *Deleted

## 2019-05-12 NOTE — Telephone Encounter (Signed)
No showed [facility]/EM[facility] appointment. 

## 2019-05-13 ENCOUNTER — Encounter: Payer: Self-pay | Admitting: Neurology

## 2019-05-15 ENCOUNTER — Ambulatory Visit: Payer: Medicaid Other | Admitting: Orthopedic Surgery

## 2019-06-02 ENCOUNTER — Ambulatory Visit: Payer: Medicaid Other | Admitting: Orthopedic Surgery

## 2019-06-03 ENCOUNTER — Ambulatory Visit: Payer: Self-pay

## 2019-06-03 ENCOUNTER — Other Ambulatory Visit: Payer: Self-pay

## 2019-06-03 ENCOUNTER — Ambulatory Visit (INDEPENDENT_AMBULATORY_CARE_PROVIDER_SITE_OTHER): Payer: Medicaid Other | Admitting: Physical Medicine and Rehabilitation

## 2019-06-03 VITALS — BP 124/81 | HR 100

## 2019-06-03 DIAGNOSIS — M5412 Radiculopathy, cervical region: Secondary | ICD-10-CM

## 2019-06-03 DIAGNOSIS — M4802 Spinal stenosis, cervical region: Secondary | ICD-10-CM

## 2019-06-03 MED ORDER — METHYLPREDNISOLONE ACETATE 80 MG/ML IJ SUSP
80.0000 mg | Freq: Once | INTRAMUSCULAR | Status: AC
  Administered 2019-06-03: 80 mg

## 2019-06-03 NOTE — Progress Notes (Signed)
 .  Numeric Pain Rating Scale and Functional Assessment Average Pain 8   In the last MONTH (on 0-10 scale) has pain interfered with the following?  1. General activity like being  able to carry out your everyday physical activities such as walking, climbing stairs, carrying groceries, or moving a chair?  Rating(4)   +Driver, -BT, -Dye Allergies.  

## 2019-06-04 ENCOUNTER — Ambulatory Visit: Payer: Medicaid Other | Admitting: Neurology

## 2019-06-13 ENCOUNTER — Other Ambulatory Visit: Payer: Self-pay

## 2019-06-13 ENCOUNTER — Ambulatory Visit (INDEPENDENT_AMBULATORY_CARE_PROVIDER_SITE_OTHER): Payer: Medicaid Other

## 2019-06-13 ENCOUNTER — Ambulatory Visit (INDEPENDENT_AMBULATORY_CARE_PROVIDER_SITE_OTHER): Payer: Medicaid Other | Admitting: Orthopedic Surgery

## 2019-06-13 ENCOUNTER — Encounter: Payer: Self-pay | Admitting: Orthopedic Surgery

## 2019-06-13 DIAGNOSIS — M25511 Pain in right shoulder: Secondary | ICD-10-CM

## 2019-06-13 DIAGNOSIS — M25562 Pain in left knee: Secondary | ICD-10-CM

## 2019-06-13 NOTE — Progress Notes (Signed)
Office Visit Note   Patient: Arthur James           Date of Birth: Mar 18, 1971           MRN: [id number] Visit Date: 06/13/2019 Requested by: [facility] [address] [place],  [place] [address] PCP: [facility]  Subjective: Chief Complaint  Patient presents with   Right Shoulder - Pain   Neck - Pain    HPI: Arthur James is a patient with right arm and neck pain.  He had surgery several years ago which was complicated by shoulder stiffness and persistent pain.  He was noted in 2018 to have right-sided C5 moderate foraminal compression.  He has had 1 epidural steroid injection in his neck 06/03/2019 which did not help.  He developed a tremor several years ago and that has become worse.  He does not really report functional use with the right arm due to shoulder pain and weakness.  He has had an MRI scan of the brain which was normal as well as MRI scan of the C-spine which does show persistent but not worsening foraminal stenosis at C5 on the right-hand side.  This is from a disc.  He has seen a neurologist.  Currently he is taking pain medicine.  He was scheduled to get an EMG nerve study of that right arm just to evaluate the cause of the strength deficit in the arm.  He also has been sent to rheumatology for increased sed rate.  He describes bilateral hand pain and arthritis.  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              ROS: All systems reviewed are negative as they relate to the chief complaint within the history of present illness.  Patient denies  fevers or chills.   Assessment & Plan: Visit Diagnoses:  1. Right shoulder pain, unspecified chronicity   2. Left knee pain, unspecified chronicity     Plan: Impression is right shoulder neck pain.  He does have a lot of trapezial pain which could be muscle spasm or could be referred pain from that foraminal stenosis.  Shot  did not help much.  He does have improved passive range of motion compared to when I saw him last.  Still not much in the way of functional strength in that arm.  I think an EMG nerve study is a reasonable thing to do at this time just to see if that C5 foraminal compression on the right-hand side is the culprit behind the weakness.  Hard to see if clear path of resolution at this time for shoulder function without involving arthroplasty options which is difficult to consider for someone his age.  We will see him back after his nerve study.  Follow-Up Instructions: No follow-ups on file.   Orders:  Orders Placed This Encounter  Procedures   XR Shoulder Right   XR KNEE 3 VIEW LEFT   Ambulatory referral to Neurology   No orders of the defined types were placed in this encounter.     Procedures: No procedures performed   Clinical Data: No additional findings.  Objective: Vital Signs: There were no vitals taken for this visit.  Physical Exam:   Constitutional: Patient appears well-developed HEENT:  Head: Normocephalic Eyes:EOM are normal Neck: Normal range of motion Cardiovascular: Normal rate Pulmonary/chest: Effort normal Neurologic: Patient is alert  Skin: Skin is warm Psychiatric: Patient has normal mood and affect    Ortho Exam: Ortho exam demonstrates pretty reasonable cervical spine range of motion.  Does have a tremor affecting his right arm which is at times affecting his body.  There is no real color difference or temperature difference right arm versus left arm and right shoulder versus left shoulder.  He is got pretty reasonable passive range of motion with external rotation to about 35 degrees on the right compared to 50 on the left.  Passively I can get him to about 90 degrees of abduction and forward flexion but he does not have the strength to get there himself.  Do not feel any real crepitus or popping or grinding with passive range of motion of the right  shoulder.  Fairly minimal tenderness to palpation of the [facility] joint on the right versus left.  Left knee is examined and he has no effusion full range of motion and good patella mobility with intact extensor mechanism.  No real atrophy in the shoulder girdle region on the right and the deltoid does fire.  Specialty Comments:  No specialty comments available.  Imaging: Xr Knee 3 View Left  Result Date: 06/13/2019 AP lateral merchant left knee reviewed.  No arthritis is present.  Alignment normal.  No fracture.  Normal left knee  Xr Shoulder Right  Result Date: 06/13/2019 AP outlet axillary right shoulder reviewed.  Acromiohumeral distance normal.  No glenohumeral arthritis.  Mild enthesopathic changes noted on the humeral head region.  Small ossicle noted in the interval where distal clavicle resection has occurred.  No fracture or dislocation present.    PMFS History: Patient Active Problem List   Diagnosis Date Noted   Tremor 12/16/2018   Neck pain 12/16/2018   Arm weakness 12/16/2018   Supraspinatus tendon tear, left, initial encounter 09/27/2016   Past Medical History:  Diagnosis Date   Neck pain    Tremor    right hand     History reviewed. No pertinent family history.  Past Surgical History:  Procedure Laterality Date   SHOULDER SURGERY Right    Social History   Occupational History   Not on file  Tobacco Use   Smoking status: Current Every Day Smoker    Packs/day: 1.00    Types: Cigarettes   Smokeless tobacco: Never Used  Substance and Sexual Activity   Alcohol use: No   Drug use: No   Sexual activity: Not on file

## 2019-08-13 NOTE — Procedures (Signed)
Cervical Epidural Steroid Injection - Interlaminar Approach with Fluoroscopic Guidance  Patient: Arthur James      Date of Birth: [birth year] MRN: [id number] [facility]CP: [facility]      Visit Date: 06/03/2019   Universal Protocol:    Date/Time: 01/13/213:52 PM  Consent Given By: the patient  Position: PRONE  Additional Comments: Vital signs were monitored before and after the procedure. Patient was prepped and draped in the usual sterile fashion. The correct patient, procedure, and site was verified.   Injection Procedure Details:  Procedure Site One Meds Administered:  Meds ordered this encounter  Medications  . methylPREDNISolone acetate (DEPO-MEDROL) injection 80 mg     Laterality: Right  Location/Site: C7-T1  Needle size: 20 G  Needle type: Touhy  Needle Placement: Paramedian epidural space  Findings:  -Comments: Excellent flow of contrast into the epidural space.  Procedure Details: Using a paramedian approach from the side mentioned above, the region overlying the inferior lamina was localized under fluoroscopic visualization and the soft tissues overlying this structure were infiltrated with 4 ml. of 1% Lidocaine without Epinephrine. A # 20 gauge, Tuohy needle was inserted into the epidural space using a paramedian approach.  The epidural space was localized using loss of resistance along with lateral and contralateral oblique bi-planar fluoroscopic views.  After negative aspirate for air, blood, and CSF, a 2 ml. volume of Isovue-250 was injected into the epidural space and the flow of contrast was observed. Radiographs were obtained for documentation purposes.   The injectate was administered into the level noted above.  Additional Comments:  The patient tolerated the procedure well Dressing: 2 x 2 sterile gauze and Band-Aid    Post-procedure details: Patient was observed during the procedure. Post-procedure instructions were reviewed.  Patient  left the clinic in stable condition.

## 2019-08-13 NOTE — Progress Notes (Signed)
Arthur James - 49 y.o. male MRN [id number]  Date of birth: 21-Feb-1971  Office Visit Note: Visit Date: 06/03/2019 PCP: [facility] Referred by: [facility]  Subjective: Chief Complaint  Patient presents with  . Neck - Pain   HPI:  Arthur James is a 49 y.o. male who comes in today At the request of Dr. Eduard Roux for cervical epidural injection.  Patient having chronic worsening right neck and shoulder pain.  He reports significant pain despite being on chronic opioid treatment as well as Lyrica.  He is on a pretty decent dose of Lyrica combined with Xtampza ER.  His pain medication is managed by [facility] medical pain management.  Historically has been on 180 tablets of 10 mg of oxycodone monthly and this is been switched to a long-acting medication.  He reports a lot of weakness and tremor.  Dr. Erlinda Hong reviewed his shoulder and wanted to see if cervical epidural injection would help.  Cervical MRI has been performed.  Patient has been followed by neurology as well as anesthesiology with Dr. Clydell Hakim in the past as well as now pain management.  There is a referral in place to see Dr. Marlou Sa for his shoulder.  There is also pending electrodiagnostic study.  On clinical interview and exam it really is hard to piece together what is the source of pain.  We will complete cervical epidural injection from an interlaminar approach at C7-T1.  ROS Otherwise per HPI.  Assessment & Plan: Visit Diagnoses:  1. Cervical radiculopathy   2. Foraminal stenosis of cervical region     Plan: No additional findings.   Meds & Orders:  Meds ordered this encounter  Medications  . methylPREDNISolone acetate (DEPO-MEDROL) injection 80 mg    Orders Placed This Encounter  Procedures  . XR C-ARM NO REPORT  . Epidural Steroid injection    Follow-up: Return for visit to requesting physician as needed.   Procedures: No procedures performed  Cervical Epidural Steroid Injection -  Interlaminar Approach with Fluoroscopic Guidance  Patient: Arthur James      Date of Birth: 1971/05/29 MRN: [id number] [facility]      Visit Date: 06/03/2019   Universal Protocol:    Date/Time: 01/13/213:52 PM  Consent Given By: the patient  Position: PRONE  Additional Comments: Vital signs were monitored before and after the procedure. Patient was prepped and draped in the usual sterile fashion. The correct patient, procedure, and site was verified.   Injection Procedure Details:  Procedure Site One Meds Administered:  Meds ordered this encounter  Medications  . methylPREDNISolone acetate (DEPO-MEDROL) injection 80 mg     Laterality: Right  Location/Site: C7-T1  Needle size: 20 G  Needle type: Touhy  Needle Placement: Paramedian epidural space  Findings:  -Comments: Excellent flow of contrast into the epidural space.  Procedure Details: Using a paramedian approach from the side mentioned above, the region overlying the inferior lamina was localized under fluoroscopic visualization and the soft tissues overlying this structure were infiltrated with 4 ml. of 1% Lidocaine without Epinephrine. A # 20 gauge, Tuohy needle was inserted into the epidural space using a paramedian approach.  The epidural space was localized using loss of resistance along with lateral and contralateral oblique bi-planar fluoroscopic views.  After negative aspirate for air, blood, and CSF, a 2 ml. volume of Isovue-250 was injected into the epidural space and the flow of contrast was observed. Radiographs were obtained for documentation purposes.   The  injectate was administered into the level noted above.  Additional Comments:  The patient tolerated the procedure well Dressing: 2 x 2 sterile gauze and Band-Aid    Post-procedure details: Patient was observed during the procedure. Post-procedure instructions were reviewed.  Patient left the clinic in stable condition.      Clinical History: EXAM: MRI of the cervical spine  ORDERING CLINICIAN: Levert Feinstein MD, PhD CLINICAL HISTORY: 50 year old man with weakness COMPARISON FILMS: 02/14/2017  TECHNIQUE: MRI of the cervical spine was obtained utilizing 3 mm sagittal slices from the posterior fossa down to the T3-4 level with T1, T2 and inversion recovery views. In addition 4 mm axial slices from C2-3 down to T1-2 level were included with T2 and gradient echo views. CONTRAST: None IMAGING SITE: [facility], [address] [place], [place], [place]  FINDINGS: :  On sagittal images, the spine is imaged from above the cervicomedullary junction to T2.   The spinal cord is of normal caliber and signal.   The vertebral bodies are normally aligned.   The vertebral bodies have normal signal.    The discs and interspaces were further evaluated on axial views from C2 to T1 as follows:  C2-C3: The disc and interspace appear normal.  C3-C4: There is mild spinal stenosis due to mild disc protrusion and uncovertebral spurring.  There is no foraminal narrowing.  No nerve root compression.  C4-C5: There is a right paramedian disc protrusion causing mild spinal stenosis and moderate right foraminal narrowing that could affect the right C5 nerve root.  No significant left foraminal narrowing.Marland Kitchen    C5-C6: There is mild spinal stenosis due to disc bulging and uncovertebral spurring.  There is moderate right greater than left foraminal narrowing that could affect the C6 nerve roots..  C6-C7: There is disc bulging and uncovertebral spurring causing mild spinal stenosis and mild bilateral foraminal narrowing but no nerve root compression.  C7-T1: The disc and interspace appear normal.  Compared to the MRI dated 02/14/2017, there is no definite interval change.  IMPRESSION: This MRI of the cervical spine shows the following: 1.    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 2.   At  C4-C5, there is moderate right foraminal narrowing that could affect the right C5 nerve root.  At C5-C6, there is moderate right greater than left foraminal narrowing that could affect the C6 nerve roots. 3.   Spinal cord has normal signal. 4.  Compared to the MRI dated 02/14/2017, there is no definite interval change.     INTERPRETING PHYSICIAN:  Richard A. Epimenio Foot, MD, PhD, [place] -------- Cervical Spine MRI 02/14/2017  C4-5 right disc protrusion and spondylosis. No central canal stenosis  MRI arthrogram Right shoulder 09/27/2016 IMPRESSION: Full-thickness, full width mildly retracted tear of the supraspinatus tendon without muscle atrophy.  Mild acromioclavicular osteoarthritis     Objective:  VS:  HT:    WT:   BMI:     BP:124/81  HR:100bpm  TEMP: ( )  RESP:  Physical Exam Neck:     Comments: Forward flexed cervical spine does have pain in the trapezius somewhat focal trigger point noted.  Any movement of the shoulder seems painful.  He does endorse a lot of weakness Musculoskeletal:     Cervical back: Neck supple. Tenderness present. No rigidity.  Lymphadenopathy:     Cervical: No cervical adenopathy.  Neurological:     General: No focal deficit present.     Mental Status: He is oriented to person,  place, and time.     Cranial Nerves: Cranial nerve deficit present.     Sensory: No sensory deficit.     Motor: Weakness present.     Coordination: Coordination abnormal.     Comments: Patient has pretty significant tremor that is really unlike a lot of the tremors I am used to seeing.  He has a negative Hoffmann's test bilaterally.  He appears to have intact sensation.  Strength testing difficult due to apprehensiveness.     Ortho Exam Imaging: No results found.

## 2019-09-16 NOTE — Progress Notes (Deleted)
PATIENT: Arthur James DOB: 11-Apr-1971  REASON FOR VISIT: follow up HISTORY FROM: patient  HISTORY OF PRESENT ILLNESS: Today 09/16/19  HISTORY  HISTORY  Burak Jnideis a 49 years old male,seen in [facility]  I have reviewed and summarized the referring note from the referring physician.He is with interpreter at today's interview.  He is a native of [place], speaks Arabi, history is through interpreter,  He was previously healthy, worked at [employer] job, had heavy object fell on his right shoulder in February 2018, since then, he had a chronic neck pain, limited range of motion of right shoulder, gradually getting worse, in 2019, he also developed right worse than left hand tremor, head titubation, which has been persistent since then, unsteady gait  He also complains of bilateral hands joints pain, swelling, deformity  Laboratory evaluationin June 2018 showedUDS was negative, elevated C-reactive protein 8.6, ESR 4, normal CBC hemoglobin of 16.3,  I personally reviewed CT head wo that was normal.  CT cervical: multiple level degenerative changes, no acute abnormalities.  Update March 12, 2019 SS: Laboratory evaluation May 2020 showed elevated ESR, CRP, PCP referred to rheumatology for further evaluation of joint pain and swelling, evidence of vitamin D deficiency, level was 10, started on oral vitamin D supplementation  MRI of cervical spine 03/03/2019 showed multilevel degenerative changes, moderate right foraminal narrowing at C4-5, C5-6 level, spinal cord was normal.  No definitive interval changes compared to previous in July 2018.  MRI of the brain was normal  He wants to send his results to get social security, and Psychologist, counselling. He has appointment on 8/26 with a judge.   Today, he is getting worse, he has pain to his head, down his right arm and leg for 6 months. He doesn't know his medications. Tremor to head,  right hand x 1 year. He isn't sure of his medications.  He brought his own [place] interpreter.  Update September 17, 2019 SS: He did not show for his nerve conduction in October 2020.  He was seeing orthopedics in November 2020, for right shoulder pain, EMG was ordered.  He was given a cervical epidural injection in November by Dr. Ernestina Patches   REVIEW OF SYSTEMS: Out of a complete 14 system review of symptoms, the patient complains only of the following symptoms, and all other reviewed systems are negative.  ALLERGIES: No Known Allergies  HOME MEDICATIONS: Outpatient Medications Prior to Visit  Medication Sig Dispense Refill  . DULoxetine (CYMBALTA) 60 MG capsule Take 1 capsule (60 mg total) by mouth daily. 30 capsule 12  . oxyCODONE-acetaminophen (PERCOCET) 10-325 MG tablet TK 1 T PO QID PRN    . predniSONE (STERAPRED UNI-PAK 21 TAB) 10 MG (21) TBPK tablet Take as directed 21 tablet 0  . pregabalin (LYRICA) 200 MG capsule TK 1 C PO BID     No facility-administered medications prior to visit.    PAST MEDICAL HISTORY: Past Medical History:  Diagnosis Date  . Neck pain   . Tremor    right hand     PAST SURGICAL HISTORY: Past Surgical History:  Procedure Laterality Date  . SHOULDER SURGERY Right     FAMILY HISTORY: No family history on file.  SOCIAL HISTORY: Social History   Socioeconomic History  . Marital status: Married    Spouse name: Not on file  . Number of children: Not on file  . Years of education: Not on file  . Highest education level:  Not on file  Occupational History  . Not on file  Tobacco Use  . Smoking status: Current Every Day Smoker    Packs/day: 1.00    Types: Cigarettes  . Smokeless tobacco: Never Used  Substance and Sexual Activity  . Alcohol use: No  . Drug use: No  . Sexual activity: Not on file  Other Topics Concern  . Not on file  Social History Narrative   Right handed    Caffeine " states a lot throughout the day"   Social  Determinants of Health   Financial Resource Strain:   . Difficulty of Paying Living Expenses: Not on file  Food Insecurity:   . Worried About Charity fundraiser in the Last Year: Not on file  . Ran Out of Food in the Last Year: Not on file  Transportation Needs:   . Lack of Transportation (Medical): Not on file  . Lack of Transportation (Non-Medical): Not on file  Physical Activity:   . Days of Exercise per Week: Not on file  . Minutes of Exercise per Session: Not on file  Stress:   . Feeling of Stress : Not on file  Social Connections:   . Frequency of Communication with Friends and Family: Not on file  . Frequency of Social Gatherings with Friends and Family: Not on file  . Attends Religious Services: Not on file  . Active Member of Clubs or Organizations: Not on file  . Attends Archivist Meetings: Not on file  . Marital Status: Not on file  Intimate Partner Violence:   . Fear of Current or Ex-Partner: Not on file  . Emotionally Abused: Not on file  . Physically Abused: Not on file  . Sexually Abused: Not on file      PHYSICAL EXAM  There were no vitals filed for this visit. There is no height or weight on file to calculate BMI.  Generalized: Well developed, in no acute distress   Neurological examination  Mentation: Alert oriented to time, place, history taking. Follows all commands speech and language fluent Cranial nerve II-XII: Pupils were equal round reactive to light. Extraocular movements were full, visual field were full on confrontational test. Facial sensation and strength were normal. Uvula tongue midline. Head turning and shoulder shrug  were normal and symmetric. Motor: The motor testing reveals 5 over 5 strength of all 4 extremities. Good symmetric motor tone is noted throughout.  Sensory: Sensory testing is intact to soft touch on all 4 extremities. No evidence of extinction is noted.  Coordination: Cerebellar testing reveals good  finger-nose-finger and heel-to-shin bilaterally.  Gait and station: Gait is normal. Tandem gait is normal. Romberg is negative. No drift is seen.  Reflexes: Deep tendon reflexes are symmetric and normal bilaterally.   DIAGNOSTIC DATA (LABS, IMAGING, TESTING) - I reviewed patient records, labs, notes, testing and imaging myself where available.  Lab Results  Component Value Date   WBC 7.6 12/16/2018   HGB 15.0 12/16/2018   HCT 42.3 12/16/2018   MCV 87 12/16/2018   PLT 246 01/26/2017      Component Value Date/Time   NA 137 12/16/2018 1700   K 4.4 12/16/2018 1700   CL 105 12/16/2018 1700   CO2 18 (L) 12/16/2018 1700   GLUCOSE 93 12/16/2018 1700   GLUCOSE 116 (H) 10/13/2015 1500   BUN 12 12/16/2018 1700   CREATININE 0.67 (L) 12/16/2018 1700   CALCIUM 8.8 12/16/2018 1700   PROT 7.2 12/16/2018 1700  ALBUMIN 4.4 12/16/2018 1700   AST 27 12/16/2018 1700   ALT 41 12/16/2018 1700   ALKPHOS 59 12/16/2018 1700   BILITOT 0.2 12/16/2018 1700   GFRNONAA 115 12/16/2018 1700   GFRAA 133 12/16/2018 1700   No results found for: CHOL, HDL, LDLCALC, LDLDIRECT, TRIG, CHOLHDL No results found for: HGBA1C Lab Results  Component Value Date   VITAMINB12 341 12/16/2018   Lab Results  Component Value Date   TSH 1.790 12/16/2018      ASSESSMENT AND PLAN 49 y.o. year old male  has a past medical history of Neck pain and Tremor. here with:  1.  Right shoulder pain 2.  Neck pain 3.  Gait abnormality 4.  Tremor -Laboratory evaluation previously has revealed elevated ESR, CRP, indicating systemic inflammatory process, he has been referred to rheumatology -Has been vitamin D deficient, started on oral vitamin D supplementation -MRI of the cervical spine showed multilevel degenerative changes, moderate right foraminal narrowing at C4-5, C5-6 level, spinal cord was normal.  No definitive interval changes compared to previous scan July 2018 -MRI of the brain was normal. -He no showed for his  EMG with Dr. Krista Blue in October 2020   I spent 15 minutes with the patient. 50% of this time was spent   Butler Denmark, [place], DNP 09/16/2019, 4:33 PM [facility] [address] [place], [place] [address] [phone]

## 2019-09-17 ENCOUNTER — Ambulatory Visit: Payer: Medicaid Other | Admitting: Neurology

## 2019-09-17 ENCOUNTER — Telehealth: Payer: Self-pay

## 2019-09-17 NOTE — Telephone Encounter (Signed)
Patient was a no call/no show for their appointment today.   

## 2019-11-17 ENCOUNTER — Telehealth: Payer: Self-pay | Admitting: Orthopedic Surgery

## 2019-11-17 NOTE — Telephone Encounter (Signed)
Patient wants xrays on a CD. (has signed a release). Please call when ready [phone]

## 2019-11-18 NOTE — Telephone Encounter (Signed)
Called patient (4/19) and advised that CD was ready for pickup at front desk.

## 2020-04-22 ENCOUNTER — Ambulatory Visit: Payer: Self-pay

## 2020-04-22 ENCOUNTER — Ambulatory Visit (INDEPENDENT_AMBULATORY_CARE_PROVIDER_SITE_OTHER): Payer: Medicaid Other | Admitting: Orthopedic Surgery

## 2020-04-22 ENCOUNTER — Encounter: Payer: Self-pay | Admitting: Orthopedic Surgery

## 2020-04-22 DIAGNOSIS — M25511 Pain in right shoulder: Secondary | ICD-10-CM

## 2020-04-22 NOTE — Progress Notes (Signed)
Office Visit Note   Patient: Arthur James           Date of Birth: April 13, 1971           MRN: [id number] Visit Date: 04/22/2020 Requested by: Simona Huh, NP [place],  [place] [address] PCP: Simona Huh, NP  Subjective: Chief Complaint  Patient presents with  . Arm Pain    HPI: Arthur James is a 49 year old patient with right shoulder arm and knee pain.  He had a long history with the right shoulder dating back years ago.  He has had tremors in the right arm.  Has radiating symptoms.  Reticular symptoms by history.  Has had 2 injections in the cervical spine without much relief.  Does have cervical spine pathology.  Reports constant pain.  He is in pain management.  He also is reporting some new numbness going down the right leg.  Denies any back pain.  States that he has pain primarily on the right side of his body.              ROS: All systems reviewed are negative as they relate to the chief complaint within the history of present illness.  Patient denies  fevers or chills.   Assessment & Plan: Visit Diagnoses:  1. Right shoulder pain, unspecified chronicity   2. Chills   3. Shaking     Plan: Impression is right shoulder and arm and leg pain.  Sounds radicular in nature but has had that worked up before in the past.  The shoulder itself actually has more external rotation than I seen at have in a while.  Still has difficulty with motion.  External rotation strength is actually excellent with the arm by his side.  Internal rotation strength of subscap testing also very good.  There is no skin color or temperature difference right arm versus left arm.  The thing he does have today is some arthritis in the DIP joints of both hands.  He is also reporting other joint complaints.  This is a new finding.  I think he may have possibly psoriatic arthritis.  No skin changes on the knees or elbows.  Plans sed rate C-reactive protein CBC differential anti-CCP antibody uric acid and  rheumatoid factor to be drawn today.  I will call him or see him back in 4 weeks with the results.  I think he may potentially also benefit from neurologic evaluation for the tremor in the right arm especially.  See him in 4 weeks  Follow-Up Instructions: No follow-ups on file.   Orders:  Orders Placed This Encounter  Procedures  . XR Shoulder Right  . CBC with Differential  . C-reactive protein  . Sed Rate (ESR)  . Anti-CCP Ab, IgG + IgA (RDL)  . Uric acid  . Rheumatoid Factor   No orders of the defined types were placed in this encounter.     Procedures: No procedures performed   Clinical Data: No additional findings.  Objective: Vital Signs: There were no vitals taken for this visit.  Physical Exam:   Constitutional: Patient appears well-developed HEENT:  Head: Normocephalic Eyes:EOM are normal Neck: Normal range of motion Cardiovascular: Normal rate Pulmonary/chest: Effort normal Neurologic: Patient is alert Skin: Skin is warm Psychiatric: Patient has normal mood and affect    Ortho Exam: Ortho exam demonstrates pretty good cervical spine range of motion.  No skin color or temperature differences right arm versus left arm.  Deltoid fires.  Biceps  triceps strength also pretty reasonable but he does not have great control over that right arm.  Does report some paresthesias C6 distribution right versus left.  Radial pulse intact bilaterally.  Does have arthritis in tenderness to palpation of the DIP joints both hands.  Thumb IP joint also affected.  No real swelling or pain or tenderness in the wrist region.  No effusion in either knee.  Knee range of motion full without synovitis.  Collateral and cruciate ligaments are stable.  No other masses lymphadenopathy or skin changes noted in the knee region.  No groin pain with internal extra rotation of the leg.  Specialty Comments:  No specialty comments available.  Imaging: XR Shoulder Right  Result Date:  04/22/2020 AP outlet and axillary right shoulder reviewed.  Shoulder is located.  Enthesopathic changes noted at the greater tuberosity.  Mild glenohumeral joint narrowing.  Acromiohumeral distance maintained.  Distal clavicle excision noted with small osteophyte superior aspect.  Visualized lung fields clear    PMFS History: Patient Active Problem List   Diagnosis Date Noted  . Tremor 12/16/2018  . Neck pain 12/16/2018  . Arm weakness 12/16/2018  . Supraspinatus tendon tear, left, initial encounter 09/27/2016   Past Medical History:  Diagnosis Date  . Neck pain   . Tremor    right hand     History reviewed. No pertinent family history.  Past Surgical History:  Procedure Laterality Date  . SHOULDER SURGERY Right    Social History   Occupational History  . Not on file  Tobacco Use  . Smoking status: Current Every Day Smoker    Packs/day: 1.00    Types: Cigarettes  . Smokeless tobacco: Never Used  Vaping Use  . Vaping Use: Never used  Substance and Sexual Activity  . Alcohol use: No  . Drug use: No  . Sexual activity: Not on file

## 2020-04-23 ENCOUNTER — Ambulatory Visit: Payer: Medicaid Other | Admitting: Surgical

## 2020-04-23 LAB — CBC WITH DIFFERENTIAL/PLATELET
Absolute Monocytes: 730 cells/uL (ref 200–950)
Basophils Absolute: 53 cells/uL (ref 0–200)
Basophils Relative: 0.7 %
Eosinophils Absolute: 122 cells/uL (ref 15–500)
Eosinophils Relative: 1.6 %
HCT: 49.2 % (ref 38.5–50.0)
Hemoglobin: 16.9 g/dL (ref 13.2–17.1)
Lymphs Abs: 2576 cells/uL (ref 850–3900)
MCH: 30.8 pg (ref 27.0–33.0)
MCHC: 34.3 g/dL (ref 32.0–36.0)
MCV: 89.6 fL (ref 80.0–100.0)
MPV: 10.8 fL (ref 7.5–12.5)
Monocytes Relative: 9.6 %
Neutro Abs: 4119 cells/uL (ref 1500–7800)
Neutrophils Relative %: 54.2 %
Platelets: 279 10*3/uL (ref 140–400)
RBC: 5.49 10*6/uL (ref 4.20–5.80)
RDW: 12.8 % (ref 11.0–15.0)
Total Lymphocyte: 33.9 %
WBC: 7.6 10*3/uL (ref 3.8–10.8)

## 2020-04-23 LAB — URIC ACID: Uric Acid, Serum: 6.9 mg/dL (ref 4.0–8.0)

## 2020-04-23 LAB — SEDIMENTATION RATE: Sed Rate: 2 mm/h (ref 0–15)

## 2020-04-23 LAB — RHEUMATOID FACTOR: Rheumatoid fact SerPl-aCnc: <14 IU/mL (ref <14)

## 2020-04-23 LAB — C-REACTIVE PROTEIN: CRP: 8.4 mg/L — ABNORMAL HIGH (ref <8.0)

## 2020-04-27 ENCOUNTER — Telehealth: Payer: Self-pay

## 2020-04-27 LAB — SPECIMEN STATUS REPORT

## 2020-04-27 LAB — ANTI-CCP AB, IGG + IGA (RDL)

## 2020-04-27 NOTE — Telephone Encounter (Signed)
FYI-  [facility] called stating that sample was collected in the wrong tube and that sample tube needed to be serum.  Stated that a form will be sent over if anything is needed.  CB# [phone].

## 2020-05-13 ENCOUNTER — Ambulatory Visit: Payer: Medicaid Other | Admitting: Orthopedic Surgery

## 2020-05-27 NOTE — Progress Notes (Signed)
Pls call thx labs neg

## 2020-05-31 ENCOUNTER — Ambulatory Visit (INDEPENDENT_AMBULATORY_CARE_PROVIDER_SITE_OTHER): Payer: Medicaid Other | Admitting: Orthopedic Surgery

## 2020-05-31 ENCOUNTER — Encounter: Payer: Self-pay | Admitting: Orthopedic Surgery

## 2020-05-31 VITALS — Ht 68.0 in | Wt 159.0 lb

## 2020-05-31 DIAGNOSIS — M542 Cervicalgia: Secondary | ICD-10-CM

## 2020-06-06 ENCOUNTER — Encounter: Payer: Self-pay | Admitting: Orthopedic Surgery

## 2020-06-06 NOTE — Progress Notes (Signed)
Office Visit Note   Patient: Arthur James           Date of Birth: [birth year]           MRN: [id number] Visit Date: 05/31/2020 Requested by: Arthur Paris, NP [phone] Arthur James [place],  [place] [address] PCP: Arthur Paris, NP  Subjective: Chief Complaint  Patient presents with  . Neck - Pain  . Right Shoulder - Pain    HPI: Patient presents for follow-up of lab work done to rule out systemic arthritis. In general Arthur James has a lot of musculoskeletal complaints. States that everything is getting worse. Interestingly his lab work was negative for any type of rheumatoid arthritis or lupus or inflammatory arthritis. He is having difficulty dealing with the pain in his neck and right shoulder. He also reports throbbing in his head. Running down to the right hand. Lyrica makes him sleepy. He is also taking baclofen and oxycodone. Has had epidural steroid injection into the cervical spine which did not help. Has tremor in the right hand.              ROS: All systems reviewed are negative as they relate to the chief complaint within the history of present illness.  Patient denies  fevers or chills.   Assessment & Plan: Visit Diagnoses:  1. Neck pain     Plan: Impression is tremor and right-sided facial symptoms which are difficult to explain given his current orthopedic situation. Interestingly his shoulder range of motion has improved significantly. I think MRI scan of the brain indicated with neurological consultation and referral to follow. I do not think there is an orthopedic surgical problem or issue at hand that can be effectively dealt with. I can talk to him or see him back after that scan.  Follow-Up Instructions: Return in about 3 weeks (around 06/21/2020).   Orders:  Orders Placed This Encounter  Procedures  . MR BRAIN WO CONTRAST   No orders of the defined types were placed in this encounter.     Procedures: No procedures performed   Clinical Data: No additional  findings.  Objective: Vital Signs: Ht 5\' 8"  (1.727 m)   Wt 159 lb (72.1 kg)   BMI 24.18 kg/m   Physical Exam:   Constitutional: Patient appears well-developed HEENT:  Head: Normocephalic Eyes:EOM are normal Neck: Normal range of motion Cardiovascular: Normal rate Pulmonary/chest: Effort normal Neurologic: Patient is alert Skin: Skin is warm Psychiatric: Patient has normal mood and affect    Ortho Exam: Ortho exam demonstrates resting tremor in the right hand. Not present in the left hand. External rotation in both shoulders is about 50 degrees which is an improvement. Rotator cuff strength slightly weaker on the right compared to the left. No real temperature difference or skin color difference right shoulder versus left shoulder. Neck has mild stiffness with flexion extension and rotation particularly to the right. No definite paresthesias right arm versus left arm C5-T1. Does have some arthritic changes in the DIP joints of the fingers in both hands. Wrist range of motion intact but no synovitis or warmth in that joint or in either elbow joint. Bilateral lower extremities have no effusion in the knees with reasonable range of motion of both ankles. No muscle atrophy in those regions. Reflexes symmetric biceps triceps patella.  Specialty Comments:  No specialty comments available.  Imaging: No results found.   PMFS History: Patient Active Problem List   Diagnosis Date Noted  . Tremor 12/16/2018  .  Neck pain 12/16/2018  . Arm weakness 12/16/2018  . Supraspinatus tendon tear, left, initial encounter 09/27/2016   Past Medical History:  Diagnosis Date  . Neck pain   . Tremor    right hand     History reviewed. No pertinent family history.  Past Surgical History:  Procedure Laterality Date  . SHOULDER SURGERY Right    Social History   Occupational History  . Not on file  Tobacco Use  . Smoking status: Current Every Day Smoker    Packs/day: 1.00    Types:  Cigarettes  . Smokeless tobacco: Never Used  Vaping Use  . Vaping Use: Never used  Substance and Sexual Activity  . Alcohol use: No  . Drug use: No  . Sexual activity: Not on file

## 2020-06-22 ENCOUNTER — Ambulatory Visit
Admission: RE | Admit: 2020-06-22 | Discharge: 2020-06-22 | Disposition: A | Discharge status: Home / Self Care | Payer: Medicaid Other | Source: Ambulatory Visit | Attending: Orthopedic Surgery | Admitting: Orthopedic Surgery

## 2020-06-22 ENCOUNTER — Other Ambulatory Visit: Payer: Self-pay

## 2020-06-22 DIAGNOSIS — M542 Cervicalgia: Secondary | ICD-10-CM

## 2020-06-23 NOTE — Progress Notes (Deleted)
Office Visit Note  Patient: Arthur James             Date of Birth: 01-Aug-1970           MRN: [id number]             PCP: Simona Huh, NP Referring: Simona Huh, NP Visit Date: 07/07/2020 Occupation: '@GUAROCC' @  Subjective:  No chief complaint on file.   History of Present Illness: Arthur James is a 49 y.o. male ***   Activities of Daily Living:  Patient reports morning stiffness for *** {minute/hour:19697}.   Patient {ACTIONS;DENIES/REPORTS:21021675::"Denies"} nocturnal pain.  Difficulty dressing/grooming: {ACTIONS;DENIES/REPORTS:21021675::"Denies"} Difficulty climbing stairs: {ACTIONS;DENIES/REPORTS:21021675::"Denies"} Difficulty getting out of chair: {ACTIONS;DENIES/REPORTS:21021675::"Denies"} Difficulty using hands for taps, buttons, cutlery, and/or writing: {ACTIONS;DENIES/REPORTS:21021675::"Denies"}  No Rheumatology ROS completed.   PMFS History:  Patient Active Problem List   Diagnosis Date Noted  . Tremor 12/16/2018  . Neck pain 12/16/2018  . Arm weakness 12/16/2018  . Supraspinatus tendon tear, left, initial encounter 09/27/2016    Past Medical History:  Diagnosis Date  . Neck pain   . Tremor    right hand     No family history on file. Past Surgical History:  Procedure Laterality Date  . SHOULDER SURGERY Right    Social History   Social History Narrative   Right handed    Caffeine " states a lot throughout the day"    There is no immunization history on file for this patient.   Objective: Vital Signs: There were no vitals taken for this visit.   Physical Exam   Musculoskeletal Exam: ***  CDAI Exam: CDAI Score: - Patient Global: -; Provider Global: - Swollen: -; Tender: - Joint Exam 07/07/2020   No joint exam has been documented for this visit   There is currently no information documented on the homunculus. Go to the Rheumatology activity and complete the homunculus joint exam.  Investigation: No additional  findings.  Imaging: MR BRAIN WO CONTRAST  Result Date: 06/22/2020 CLINICAL DATA:  Tremor and headache EXAM: MRI HEAD WITHOUT CONTRAST TECHNIQUE: Multiplanar, multiecho pulse sequences of the brain and surrounding structures were obtained without intravenous contrast. COMPARISON:  03/03/2019 FINDINGS: Brain: No acute infarct, acute hemorrhage or extra-axial collection. Normal white matter signal. Normal volume of CSF spaces. No chronic microhemorrhage. Normal midline structures. Vascular: Major flow voids are preserved. Skull and upper cervical spine: Normal calvarium and skull base. Visualized upper cervical spine and soft tissues are normal. Sinuses/Orbits:No paranasal sinus fluid levels or advanced mucosal thickening. No mastoid or middle ear effusion. Normal orbits. IMPRESSION: Normal brain MRI. Electronically Signed   By: Ulyses Jarred M.D.   On: 06/22/2020 21:13    Recent Labs: Lab Results  Component Value Date   WBC 7.6 04/22/2020   HGB 16.9 04/22/2020   PLT 279 04/22/2020   NA 137 12/16/2018   K 4.4 12/16/2018   CL 105 12/16/2018   CO2 18 (L) 12/16/2018   GLUCOSE 93 12/16/2018   BUN 12 12/16/2018   CREATININE 0.67 (L) 12/16/2018   BILITOT 0.2 12/16/2018   ALKPHOS 59 12/16/2018   AST 27 12/16/2018   ALT 41 12/16/2018   PROT 7.2 12/16/2018   ALBUMIN 4.4 12/16/2018   CALCIUM 8.8 12/16/2018   GFRAA 133 12/16/2018    Speciality Comments: No specialty comments available.  Procedures:  No procedures performed Allergies: Patient has no known allergies.   Assessment / Plan:     Visit Diagnoses: Arthropathy - 02/19/19: ANA negative, RF 9 (repeat 11), CRP  17, ESR 54, Vit D 13.37, uric acid 6.1, Mg 2.1  Primary osteoarthritis of both hands  Tremor of right hand  Elevated sed rate  Neuropathic pain  Chronic pain syndrome  Vitamin D deficiency  Other fatigue  Gait disorder  Orders: No orders of the defined types were placed in this encounter.  No orders of the  defined types were placed in this encounter.   Face-to-face time spent with patient was *** minutes. Greater than 50% of time was spent in counseling and coordination of care.  Follow-Up Instructions: No follow-ups on file.   Ofilia Neas, PA-C  Note - This record has been created using Dragon software.  Chart creation errors have been sought, but may not always  have been located. Such creation errors do not reflect on  the standard of medical care.

## 2020-06-28 ENCOUNTER — Ambulatory Visit: Payer: Medicaid Other | Admitting: Orthopedic Surgery

## 2020-06-29 NOTE — Progress Notes (Signed)
I would say we need to call him but I do not know that we can communicate with him.  Can you find out which rheumatologist he is going to see and I can send him a note that his brain MRI scan was negative.  Thanks

## 2020-07-07 ENCOUNTER — Ambulatory Visit: Payer: Medicaid Other | Admitting: Rheumatology

## 2020-07-07 DIAGNOSIS — G894 Chronic pain syndrome: Secondary | ICD-10-CM

## 2020-07-07 DIAGNOSIS — R700 Elevated erythrocyte sedimentation rate: Secondary | ICD-10-CM

## 2020-07-07 DIAGNOSIS — M19041 Primary osteoarthritis, right hand: Secondary | ICD-10-CM

## 2020-07-07 DIAGNOSIS — R251 Tremor, unspecified: Secondary | ICD-10-CM

## 2020-07-07 DIAGNOSIS — R269 Unspecified abnormalities of gait and mobility: Secondary | ICD-10-CM

## 2020-07-07 DIAGNOSIS — M129 Arthropathy, unspecified: Secondary | ICD-10-CM

## 2020-07-07 DIAGNOSIS — E559 Vitamin D deficiency, unspecified: Secondary | ICD-10-CM

## 2020-07-07 DIAGNOSIS — M792 Neuralgia and neuritis, unspecified: Secondary | ICD-10-CM

## 2020-07-07 DIAGNOSIS — R5383 Other fatigue: Secondary | ICD-10-CM

## 2020-07-09 ENCOUNTER — Ambulatory Visit (INDEPENDENT_AMBULATORY_CARE_PROVIDER_SITE_OTHER): Payer: Medicaid Other | Admitting: Orthopedic Surgery

## 2020-07-09 DIAGNOSIS — M25511 Pain in right shoulder: Secondary | ICD-10-CM

## 2020-07-09 DIAGNOSIS — R251 Tremor, unspecified: Secondary | ICD-10-CM

## 2020-07-10 ENCOUNTER — Encounter: Payer: Self-pay | Admitting: Orthopedic Surgery

## 2020-07-10 NOTE — Progress Notes (Signed)
Office Visit Note   Patient: Arthur James           Date of Birth: 1971-04-19           MRN: [id number] Visit Date: 07/09/2020 Requested by: Courtney Paris, NP [address] [place],  [place] [address] PCP: Courtney Paris, NP  Subjective: Chief Complaint  Patient presents with  . Other     Scan review    HPI: Patient presents for follow-up of brain MRI scan.  Patient has multiple complaints.  He has had shoulder surgery over 10 years ago.  Still having some problems with that right arm but in general his motion has improved.  He has a tremor in the right arm.  Currently is in pain management and takes pain medicine.  He has reported multiple joint pains in the past and has been worked up for rheumatoid arthritis and psoriatic arthritis and has undergone screening blood tests for common orthopedic rheumatologic problems all of which were negative.  He has had injections in his neck in the past which he states did not help.  He does not want any more injections in his neck for possible radicular symptoms.  He does report significant tremor in the right hand.  He has never seen a neurologist for evaluation and work-up of that problem.              ROS: All systems reviewed are negative as they relate to the chief complaint within the history of present illness.  Patient denies  fevers or chills.   Assessment & Plan: Visit Diagnoses:  1. Right shoulder pain, unspecified chronicity   2. Tremor     Plan: Impression is multiple medical issues and the patient who does not really have an orthopedic surgical problem at this time despite extensive work-up.  Brain scan was negative for any type of lesion which could be causing some of his headaches and right-sided facial pain and potential other problems.  I do not think I can help him from an orthopedic surgical perspective at this time.  I do think with the tremor in his hand that work-up is indicated for possible treatment.  He also has multiple  medical problems for which he should follow-up with his primary care provider.  I will see him back as needed.  Follow-Up Instructions: Return if symptoms worsen or fail to improve.   Orders:  Orders Placed This Encounter  Procedures  . Ambulatory referral to Neurology   No orders of the defined types were placed in this encounter.     Procedures: No procedures performed   Clinical Data: No additional findings.  Objective: Vital Signs: There were no vitals taken for this visit.  Physical Exam:   Constitutional: Patient appears well-developed HEENT:  Head: Normocephalic Eyes:EOM are normal Neck: Normal range of motion Cardiovascular: Normal rate Pulmonary/chest: Effort normal Neurologic: Patient is alert Skin: Skin is warm Psychiatric: Patient has normal mood and affect    Ortho Exam: Ortho exam demonstrates resting tremor in the right arm.  He has reasonable grip strength.  Deltoid fires bilaterally.  Not too much in terms of muscle atrophy in either forearm or arm.  Neck range of motion is little bit more tender with rotation to the right.  No groin pain with internal extra rotation of the leg.  Right leg has a little bit more muscle spasm in the left leg.  No muscle fasciculations noted.  Specialty Comments:  No specialty comments available.  Imaging:  No results found.   PMFS History: Patient Active Problem List   Diagnosis Date Noted  . Tremor 12/16/2018  . Neck pain 12/16/2018  . Arm weakness 12/16/2018  . Supraspinatus tendon tear, left, initial encounter 09/27/2016   Past Medical History:  Diagnosis Date  . Neck pain   . Tremor    right hand     History reviewed. No pertinent family history.  Past Surgical History:  Procedure Laterality Date  . SHOULDER SURGERY Right    Social History   Occupational History  . Not on file  Tobacco Use  . Smoking status: Current Every Day Smoker    Packs/day: 1.00    Types: Cigarettes  . Smokeless  tobacco: Never Used  Vaping Use  . Vaping Use: Never used  Substance and Sexual Activity  . Alcohol use: No  . Drug use: No  . Sexual activity: Not on file

## 2020-10-27 ENCOUNTER — Ambulatory Visit: Payer: Medicaid Other | Admitting: Orthopedic Surgery

## 2020-10-27 ENCOUNTER — Ambulatory Visit (INDEPENDENT_AMBULATORY_CARE_PROVIDER_SITE_OTHER): Payer: Medicaid Other

## 2020-10-27 ENCOUNTER — Other Ambulatory Visit: Payer: Self-pay

## 2020-10-27 DIAGNOSIS — M5416 Radiculopathy, lumbar region: Secondary | ICD-10-CM

## 2020-11-02 ENCOUNTER — Encounter: Payer: Self-pay | Admitting: Orthopedic Surgery

## 2020-11-02 NOTE — Progress Notes (Signed)
Office Visit Note   Patient: Arthur James           Date of Birth: [birth year]           MRN: [id number] Visit Date: 10/27/2020 Requested by: Courtney Paris, NP [address] [place],  [place] [address] PCP: Courtney Paris, NP  Subjective: Chief Complaint  Patient presents with  . Right Shoulder - Pain    HPI: Arthur James is a patient with multiple orthopedic complaints.  He had rotator cuff surgery done several years ago.  Has had one MRI scan which showed intact repair.  That was subsequent to his surgery.  Another MRI is in the chart which shows some recurrent tearing of the rotator cuff but I do not have access to those images.  His shoulder has been pretty stiff since surgery.  He did have a manipulation and arthroscopic release which helped only marginally.  Rotator cuff repair was intact at that time.  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  He has received several epidural steroid injections for those but that is only given him short-term relief.  Is not interested in any type of surgery for any of these maladies.  He describes pain radiating from the neck down below the elbow.  He also has tremors and headaches.  He was referred to neurology for that.  They initiated a work-up and recommended EMG nerve study but he did not make that follow-up appointment.  He is on significant amount of pain medicine.  His newest symptoms over the past 2 months has been right leg weakness and numbness and tingling and giving way.  Denies any history of trauma or injury to that right leg.  He is here with a translator today.  His shoulder was reasonably mobile at the last clinic visit but he describes stiffness since that visit.              ROS: All systems reviewed are negative as they relate to the chief complaint within the history of present illness.  Patient denies  fevers or chills.   Assessment & Plan: Visit Diagnoses:  1. Radiculopathy,  lumbar region     Plan: Impression is slightly worsening right shoulder range of motion with tremor in the right arm and persistent radicular type symptoms.  I will think there is anything surgical to do for the shoulder at this time particularly given the amount of pain medicine that he requires to get through the day.  He is not interested in any type of neck surgery or neck injections.  With these restrictions is hard to see a clear path forward for resolution of his symptoms.  The tremor aspect is present but not really treatable by [place] [facility] care.  He is somewhat reluctant to follow-up with neurology for the EMG nerve study.  Regarding the back and leg symptoms I think this is a new symptom which has been going on for 2 months.  He has been having some falling.  Examination is somewhat difficult but I think he does have slight amount of weakness with hip flexion and leg extension on the right-hand side compared to the left.  MRI lumbar spine indicated at this time to evaluate for right-sided radiculopathy.  We will see him back after that study.  Follow-Up Instructions: Return for after MRI.   Orders:  Orders Placed This Encounter  Procedures  . XR Lumbar Spine 2-3 Views  .  MR Lumbar Spine w/o contrast   No orders of the defined types were placed in this encounter.     Procedures: No procedures performed   Clinical Data: No additional findings.  Objective: Vital Signs: There were no vitals taken for this visit.  Physical Exam:   Constitutional: Patient appears well-developed HEENT:  Head: Normocephalic Eyes:EOM are normal Neck: Normal range of motion Cardiovascular: Normal rate Pulmonary/chest: Effort normal Neurologic: Patient is alert Skin: Skin is warm Psychiatric: Patient has tremors and uses a cane for ambulation.  He has slightly higher level of agitation than with prior clinic visits.    Ortho Exam: Ortho exam demonstrates pretty reasonable cervical spine  range of motion.  Does have mild pain when he turns his head to the right.  Shoulder range of motion is less today than it has been in the past.  Has only about 20 degrees of external rotation 50 of isolated glenohumeral abduction and about 70 of forward flexion passively.  He is quite tender with any manipulation of the shoulder.  The shoulder itself is not warm and there is no skin color differences or temperature differences between the right and left shoulder.  He has a tremor in the right arm which is not present in the left arm.  Radial pulse intact bilaterally.  Has pretty reasonable biceps triceps and deltoid strength with slightly asymmetric grip strength on the right compared to the left.  Patient has positive nerve root tension signs on the right compared to the left but no groin pain with internal X rotation leg.  Negative Babinski negative clonus.  Mild pain with forward lateral bending.  Hip flexion slightly weaker on the right compared to the left along with extension strength which is also slightly weaker at 5- out of 5 on the right compared to the left.  Paresthesias present L5 distribution right versus left.  Specialty Comments:  No specialty comments available.  Imaging: No results found.   PMFS History: Patient Active Problem List   Diagnosis Date Noted  . Tremor 12/16/2018  . Neck pain 12/16/2018  . Arm weakness 12/16/2018  . Supraspinatus tendon tear, left, initial encounter 09/27/2016   Past Medical History:  Diagnosis Date  . Neck pain   . Tremor    right hand     No family history on file.  Past Surgical History:  Procedure Laterality Date  . SHOULDER SURGERY Right    Social History   Occupational History  . Not on file  Tobacco Use  . Smoking status: Current Every Day Smoker    Packs/day: 1.00    Types: Cigarettes  . Smokeless tobacco: Never Used  Vaping Use  . Vaping Use: Never used  Substance and Sexual Activity  . Alcohol use: No  . Drug use: No   . Sexual activity: Not on file

## 2020-12-03 ENCOUNTER — Emergency Department (HOSPITAL_COMMUNITY): Payer: Medicaid Other

## 2020-12-03 ENCOUNTER — Inpatient Hospital Stay (HOSPITAL_COMMUNITY)
Admission: EM | Admit: 2020-12-03 | Discharge: 2020-12-11 | DRG: 907 | Disposition: A | Discharge status: Home / Self Care | Payer: Medicaid Other | Attending: General Surgery | Admitting: General Surgery

## 2020-12-03 DIAGNOSIS — T1490XA Injury, unspecified, initial encounter: Secondary | ICD-10-CM

## 2020-12-03 DIAGNOSIS — Z79899 Other long term (current) drug therapy: Secondary | ICD-10-CM

## 2020-12-03 DIAGNOSIS — S31134A Puncture wound of abdominal wall without foreign body, left lower quadrant without penetration into peritoneal cavity, initial encounter: Secondary | ICD-10-CM | POA: Diagnosis present

## 2020-12-03 DIAGNOSIS — R45851 Suicidal ideations: Secondary | ICD-10-CM | POA: Diagnosis present

## 2020-12-03 DIAGNOSIS — Z20822 Contact with and (suspected) exposure to covid-19: Secondary | ICD-10-CM | POA: Diagnosis present

## 2020-12-03 DIAGNOSIS — R509 Fever, unspecified: Secondary | ICD-10-CM | POA: Diagnosis not present

## 2020-12-03 DIAGNOSIS — F32A Depression, unspecified: Secondary | ICD-10-CM | POA: Diagnosis present

## 2020-12-03 DIAGNOSIS — R578 Other shock: Secondary | ICD-10-CM | POA: Diagnosis present

## 2020-12-03 DIAGNOSIS — D62 Acute posthemorrhagic anemia: Secondary | ICD-10-CM | POA: Diagnosis present

## 2020-12-03 DIAGNOSIS — W3400XA Accidental discharge from unspecified firearms or gun, initial encounter: Secondary | ICD-10-CM

## 2020-12-03 DIAGNOSIS — I10 Essential (primary) hypertension: Secondary | ICD-10-CM | POA: Diagnosis present

## 2020-12-03 DIAGNOSIS — F17210 Nicotine dependence, cigarettes, uncomplicated: Secondary | ICD-10-CM | POA: Diagnosis present

## 2020-12-03 DIAGNOSIS — E7800 Pure hypercholesterolemia, unspecified: Secondary | ICD-10-CM | POA: Diagnosis not present

## 2020-12-03 DIAGNOSIS — Z23 Encounter for immunization: Secondary | ICD-10-CM

## 2020-12-03 DIAGNOSIS — J9601 Acute respiratory failure with hypoxia: Secondary | ICD-10-CM | POA: Diagnosis present

## 2020-12-03 DIAGNOSIS — F439 Reaction to severe stress, unspecified: Secondary | ICD-10-CM | POA: Diagnosis present

## 2020-12-03 DIAGNOSIS — Z9151 Personal history of suicidal behavior: Secondary | ICD-10-CM

## 2020-12-03 DIAGNOSIS — I743 Embolism and thrombosis of arteries of the lower extremities: Secondary | ICD-10-CM | POA: Diagnosis present

## 2020-12-03 DIAGNOSIS — S75022A Major laceration of femoral artery, left leg, initial encounter: Principal | ICD-10-CM | POA: Diagnosis present

## 2020-12-03 DIAGNOSIS — Z560 Unemployment, unspecified: Secondary | ICD-10-CM

## 2020-12-03 DIAGNOSIS — N179 Acute kidney failure, unspecified: Secondary | ICD-10-CM | POA: Diagnosis present

## 2020-12-03 LAB — CBC
HCT: 39.4 % (ref 39.0–52.0)
Hemoglobin: 12.6 g/dL — ABNORMAL LOW (ref 13.0–17.0)
MCH: 30 pg (ref 26.0–34.0)
MCHC: 32 g/dL (ref 30.0–36.0)
MCV: 93.8 fL (ref 80.0–100.0)
Platelets: 409 10*3/uL — ABNORMAL HIGH (ref 150–400)
RBC: 4.2 MIL/uL — ABNORMAL LOW (ref 4.22–5.81)
RDW: 12.7 % (ref 11.5–15.5)
WBC: 19.1 10*3/uL — ABNORMAL HIGH (ref 4.0–10.5)
nRBC: 0 % (ref 0.0–0.2)

## 2020-12-03 LAB — I-STAT CHEM 8, ED
BUN: 14 mg/dL (ref 6–20)
Calcium, Ion: 1.05 mmol/L — ABNORMAL LOW (ref 1.15–1.40)
Chloride: 106 mmol/L (ref 98–111)
Creatinine, Ser: 1.2 mg/dL (ref 0.61–1.24)
Glucose, Bld: 177 mg/dL — ABNORMAL HIGH (ref 70–99)
HCT: 38 % — ABNORMAL LOW (ref 39.0–52.0)
Hemoglobin: 12.9 g/dL — ABNORMAL LOW (ref 13.0–17.0)
Potassium: 2.9 mmol/L — ABNORMAL LOW (ref 3.5–5.1)
Sodium: 143 mmol/L (ref 135–145)
TCO2: 19 mmol/L — ABNORMAL LOW (ref 22–32)

## 2020-12-03 MED ORDER — FENTANYL BOLUS VIA INFUSION
50.0000 ug | INTRAVENOUS | Status: DC | PRN
  Administered 2020-12-04 – 2020-12-05 (×2): 50 ug via INTRAVENOUS
  Filled 2020-12-03: qty 100

## 2020-12-03 MED ORDER — PROPOFOL 1000 MG/100ML IV EMUL
5.0000 ug/kg/min | INTRAVENOUS | Status: DC
  Administered 2020-12-04: 25 ug/kg/min via INTRAVENOUS
  Administered 2020-12-04: 30 ug/kg/min via INTRAVENOUS
  Filled 2020-12-03 (×3): qty 100

## 2020-12-03 MED ORDER — TRANEXAMIC ACID 1000 MG/10ML IV SOLN
1000.0000 mg | Freq: Once | INTRAVENOUS | Status: AC
  Administered 2020-12-03: 1000 mg via INTRAVENOUS
  Filled 2020-12-03: qty 10

## 2020-12-03 MED ORDER — TETANUS-DIPHTH-ACELL PERTUSSIS 5-2.5-18.5 LF-MCG/0.5 IM SUSY
0.5000 mL | PREFILLED_SYRINGE | Freq: Once | INTRAMUSCULAR | Status: AC
  Administered 2020-12-03: 0.5 mL via INTRAMUSCULAR

## 2020-12-03 MED ORDER — ROCURONIUM BROMIDE 50 MG/5ML IV SOLN
INTRAVENOUS | Status: AC | PRN
  Administered 2020-12-03: 80 mg via INTRAVENOUS

## 2020-12-03 MED ORDER — FENTANYL CITRATE (PF) 100 MCG/2ML IJ SOLN
50.0000 ug | Freq: Once | INTRAMUSCULAR | Status: AC
  Administered 2020-12-03: 100 ug via INTRAVENOUS

## 2020-12-03 MED ORDER — FENTANYL 2500MCG IN NS 250ML (10MCG/ML) PREMIX INFUSION
50.0000 ug/h | INTRAVENOUS | Status: DC
  Administered 2020-12-03: 150 ug/h via INTRAVENOUS
  Administered 2020-12-04 (×2): 200 ug/h via INTRAVENOUS
  Administered 2020-12-04: 150 ug/h via INTRAVENOUS
  Administered 2020-12-05: 100 ug/h via INTRAVENOUS
  Filled 2020-12-03 (×4): qty 250

## 2020-12-03 MED ORDER — ETOMIDATE 2 MG/ML IV SOLN
INTRAVENOUS | Status: AC | PRN
  Administered 2020-12-03: 20 mg via INTRAVENOUS

## 2020-12-03 MED ORDER — IOHEXOL 350 MG/ML SOLN
75.0000 mL | Freq: Once | INTRAVENOUS | Status: AC | PRN
  Administered 2020-12-03: 75 mL via INTRAVENOUS

## 2020-12-03 MED ORDER — TRANEXAMIC ACID FOR EPISTAXIS
1000.0000 mg | Freq: Once | TOPICAL | Status: AC
  Administered 2020-12-03: 1000 mg via TOPICAL
  Filled 2020-12-03: qty 10

## 2020-12-03 MED ORDER — TRANEXAMIC ACID-NACL 1000-0.7 MG/100ML-% IV SOLN
1000.0000 mg | Freq: Once | INTRAVENOUS | Status: DC
  Filled 2020-12-03: qty 100

## 2020-12-03 MED ORDER — CEFAZOLIN SODIUM-DEXTROSE 2-4 GM/100ML-% IV SOLN
2.0000 g | Freq: Once | INTRAVENOUS | Status: AC
  Administered 2020-12-03: 2 g via INTRAVENOUS

## 2020-12-03 NOTE — Progress Notes (Signed)
Patient transported to CT and back without complications.  

## 2020-12-03 NOTE — ED Notes (Signed)
TXA 8hr infusion started.
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
## 2020-12-03 NOTE — Progress Notes (Signed)
Orthopedic Tech Progress Note Patient Details:  Arthur James 30-Jan-1971 [id number]  Patient ID: Arthur James, male   DOB: 1970/12/08, 50 y.o.   MRN: [id number]   Arthur James 12/03/2020, 11:52 PM trauma
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
## 2020-12-03 NOTE — ED Notes (Signed)
IV txa completed.

## 2020-12-04 ENCOUNTER — Inpatient Hospital Stay (HOSPITAL_COMMUNITY): Payer: Medicaid Other | Admitting: Anesthesiology

## 2020-12-04 ENCOUNTER — Encounter (HOSPITAL_COMMUNITY): Admission: EM | Disposition: A | Discharge status: Home / Self Care | Payer: Self-pay | Source: Home / Self Care

## 2020-12-04 DIAGNOSIS — S75092A Other specified injury of femoral artery, left leg, initial encounter: Secondary | ICD-10-CM

## 2020-12-04 DIAGNOSIS — J9601 Acute respiratory failure with hypoxia: Secondary | ICD-10-CM | POA: Diagnosis present

## 2020-12-04 DIAGNOSIS — D62 Acute posthemorrhagic anemia: Secondary | ICD-10-CM | POA: Diagnosis present

## 2020-12-04 DIAGNOSIS — F17210 Nicotine dependence, cigarettes, uncomplicated: Secondary | ICD-10-CM | POA: Diagnosis present

## 2020-12-04 DIAGNOSIS — Z20822 Contact with and (suspected) exposure to covid-19: Secondary | ICD-10-CM | POA: Diagnosis present

## 2020-12-04 DIAGNOSIS — F32A Depression, unspecified: Secondary | ICD-10-CM | POA: Diagnosis present

## 2020-12-04 DIAGNOSIS — E7800 Pure hypercholesterolemia, unspecified: Secondary | ICD-10-CM | POA: Diagnosis not present

## 2020-12-04 DIAGNOSIS — T1490XA Injury, unspecified, initial encounter: Secondary | ICD-10-CM | POA: Diagnosis present

## 2020-12-04 DIAGNOSIS — N179 Acute kidney failure, unspecified: Secondary | ICD-10-CM | POA: Diagnosis present

## 2020-12-04 DIAGNOSIS — I10 Essential (primary) hypertension: Secondary | ICD-10-CM | POA: Diagnosis present

## 2020-12-04 DIAGNOSIS — S75022A Major laceration of femoral artery, left leg, initial encounter: Secondary | ICD-10-CM | POA: Diagnosis present

## 2020-12-04 DIAGNOSIS — Z560 Unemployment, unspecified: Secondary | ICD-10-CM | POA: Diagnosis not present

## 2020-12-04 DIAGNOSIS — Z9151 Personal history of suicidal behavior: Secondary | ICD-10-CM | POA: Diagnosis not present

## 2020-12-04 DIAGNOSIS — I743 Embolism and thrombosis of arteries of the lower extremities: Secondary | ICD-10-CM | POA: Diagnosis present

## 2020-12-04 DIAGNOSIS — S31134A Puncture wound of abdominal wall without foreign body, left lower quadrant without penetration into peritoneal cavity, initial encounter: Secondary | ICD-10-CM | POA: Diagnosis present

## 2020-12-04 DIAGNOSIS — F439 Reaction to severe stress, unspecified: Secondary | ICD-10-CM | POA: Diagnosis present

## 2020-12-04 DIAGNOSIS — R45851 Suicidal ideations: Secondary | ICD-10-CM | POA: Diagnosis present

## 2020-12-04 DIAGNOSIS — W3400XA Accidental discharge from unspecified firearms or gun, initial encounter: Secondary | ICD-10-CM

## 2020-12-04 DIAGNOSIS — Z23 Encounter for immunization: Secondary | ICD-10-CM | POA: Diagnosis not present

## 2020-12-04 DIAGNOSIS — Z79899 Other long term (current) drug therapy: Secondary | ICD-10-CM | POA: Diagnosis not present

## 2020-12-04 DIAGNOSIS — R509 Fever, unspecified: Secondary | ICD-10-CM | POA: Diagnosis not present

## 2020-12-04 DIAGNOSIS — R578 Other shock: Secondary | ICD-10-CM | POA: Diagnosis present

## 2020-12-04 HISTORY — PX: FEMORAL ARTERY EXPLORATION: SHX5160

## 2020-12-04 HISTORY — PX: APPLICATION OF WOUND VAC: SHX5189

## 2020-12-04 HISTORY — PX: FASCIOTOMY: SHX132

## 2020-12-04 LAB — BPAM FFP
Blood Product Expiration Date: 202205082359
Blood Product Expiration Date: 202205102359
Blood Product Expiration Date: 202205112359
Blood Product Expiration Date: 202205112359
Blood Product Expiration Date: 202205112359
Blood Product Expiration Date: 202205112359
Blood Product Expiration Date: 202205112359
Blood Product Expiration Date: 202205112359
Blood Product Expiration Date: 202205242359
Blood Product Expiration Date: 202205252359
Blood Product Expiration Date: 202205252359
Blood Product Expiration Date: 202205262359
ISSUE DATE / TIME: 202205062306
ISSUE DATE / TIME: 202205062306
ISSUE DATE / TIME: 202205062306
ISSUE DATE / TIME: 202205062306
ISSUE DATE / TIME: 202205062311
ISSUE DATE / TIME: 202205062311
ISSUE DATE / TIME: 202205062311
ISSUE DATE / TIME: 202205062311
ISSUE DATE / TIME: 202205062323
ISSUE DATE / TIME: 202205062323
ISSUE DATE / TIME: 202205062323
ISSUE DATE / TIME: 202205062323
Unit Type and Rh: 6200
Unit Type and Rh: 6200
Unit Type and Rh: 6200
Unit Type and Rh: 6200
Unit Type and Rh: 6200
Unit Type and Rh: 6200
Unit Type and Rh: 6200
Unit Type and Rh: 6200
Unit Type and Rh: 6200
Unit Type and Rh: 6200
Unit Type and Rh: 6200
Unit Type and Rh: 6200

## 2020-12-04 LAB — I-STAT ARTERIAL BLOOD GAS, ED
Acid-base deficit: 4 mmol/L — ABNORMAL HIGH (ref 0.0–2.0)
Bicarbonate: 22.3 mmol/L (ref 20.0–28.0)
Calcium, Ion: 1.07 mmol/L — ABNORMAL LOW (ref 1.15–1.40)
HCT: 34 % — ABNORMAL LOW (ref 39.0–52.0)
Hemoglobin: 11.6 g/dL — ABNORMAL LOW (ref 13.0–17.0)
O2 Saturation: 86 %
Patient temperature: 96.4
Potassium: 2.8 mmol/L — ABNORMAL LOW (ref 3.5–5.1)
Sodium: 140 mmol/L (ref 135–145)
TCO2: 24 mmol/L (ref 22–32)
pCO2 arterial: 40 mmHg (ref 32.0–48.0)
pH, Arterial: 7.348 — ABNORMAL LOW (ref 7.350–7.450)
pO2, Arterial: 50 mmHg — ABNORMAL LOW (ref 83.0–108.0)

## 2020-12-04 LAB — COMPREHENSIVE METABOLIC PANEL
ALT: 33 U/L (ref 0–44)
AST: 27 U/L (ref 15–41)
Albumin: 3.4 g/dL — ABNORMAL LOW (ref 3.5–5.0)
Alkaline Phosphatase: 53 U/L (ref 38–126)
Anion gap: 14 (ref 5–15)
BUN: 13 mg/dL (ref 6–20)
CO2: 20 mmol/L — ABNORMAL LOW (ref 22–32)
Calcium: 8.2 mg/dL — ABNORMAL LOW (ref 8.9–10.3)
Chloride: 106 mmol/L (ref 98–111)
Creatinine, Ser: 1.39 mg/dL — ABNORMAL HIGH (ref 0.61–1.24)
GFR, Estimated: >60 mL/min (ref >60)
Glucose, Bld: 181 mg/dL — ABNORMAL HIGH (ref 70–99)
Potassium: 2.8 mmol/L — ABNORMAL LOW (ref 3.5–5.1)
Sodium: 140 mmol/L (ref 135–145)
Total Bilirubin: 0.3 mg/dL (ref 0.3–1.2)
Total Protein: 6 g/dL — ABNORMAL LOW (ref 6.5–8.1)

## 2020-12-04 LAB — RESP PANEL BY RT-PCR (FLU A&B, COVID) ARPGX2
Influenza A by PCR: NEGATIVE
Influenza B by PCR: NEGATIVE
SARS Coronavirus 2 by RT PCR: NEGATIVE

## 2020-12-04 LAB — PREPARE FRESH FROZEN PLASMA
Unit division: 0
Unit division: 0
Unit division: 0
Unit division: 0
Unit division: 0
Unit division: 0
Unit division: 0
Unit division: 0
Unit division: 0
Unit division: 0
Unit division: 0

## 2020-12-04 LAB — TRAUMA TEG PANEL
CFF Max Amplitude: 24.5 mm (ref 15–32)
Citrated Kaolin (R): 2.9 min — ABNORMAL LOW (ref 4.6–9.1)
Citrated Rapid TEG (MA): 66.3 mm (ref 52–70)
Lysis at 30 Minutes: 0 % (ref 0.0–2.6)

## 2020-12-04 LAB — PROTIME-INR
INR: 1 (ref 0.8–1.2)
INR: 1.1 (ref 0.8–1.2)
Prothrombin Time: 13.5 seconds (ref 11.4–15.2)
Prothrombin Time: 14.4 seconds (ref 11.4–15.2)

## 2020-12-04 LAB — CBC
HCT: 33.3 % — ABNORMAL LOW (ref 39.0–52.0)
Hemoglobin: 11.3 g/dL — ABNORMAL LOW (ref 13.0–17.0)
MCH: 30.8 pg (ref 26.0–34.0)
MCHC: 33.9 g/dL (ref 30.0–36.0)
MCV: 90.7 fL (ref 80.0–100.0)
Platelets: 231 10*3/uL (ref 150–400)
RBC: 3.67 MIL/uL — ABNORMAL LOW (ref 4.22–5.81)
RDW: 13.4 % (ref 11.5–15.5)
WBC: 20.8 10*3/uL — ABNORMAL HIGH (ref 4.0–10.5)
nRBC: 0 % (ref 0.0–0.2)

## 2020-12-04 LAB — LIPID PANEL
Cholesterol: 203 mg/dL — ABNORMAL HIGH (ref 0–200)
HDL: 28 mg/dL — ABNORMAL LOW (ref >40)
LDL Cholesterol: 143 mg/dL — ABNORMAL HIGH (ref 0–99)
Total CHOL/HDL Ratio: 7.3 RATIO
Triglycerides: 160 mg/dL — ABNORMAL HIGH (ref <150)
VLDL: 32 mg/dL (ref 0–40)

## 2020-12-04 LAB — MRSA PCR SCREENING: MRSA by PCR: NEGATIVE

## 2020-12-04 LAB — LACTIC ACID, PLASMA: Lactic Acid, Venous: 9.9 mmol/L (ref 0.5–1.9)

## 2020-12-04 LAB — BASIC METABOLIC PANEL
Anion gap: 8 (ref 5–15)
BUN: 10 mg/dL (ref 6–20)
CO2: 24 mmol/L (ref 22–32)
Calcium: 7.6 mg/dL — ABNORMAL LOW (ref 8.9–10.3)
Chloride: 108 mmol/L (ref 98–111)
Creatinine, Ser: 0.92 mg/dL (ref 0.61–1.24)
GFR, Estimated: >60 mL/min (ref >60)
Glucose, Bld: 97 mg/dL (ref 70–99)
Potassium: 4 mmol/L (ref 3.5–5.1)
Sodium: 140 mmol/L (ref 135–145)

## 2020-12-04 LAB — BLOOD PRODUCT ORDER (VERBAL) VERIFICATION

## 2020-12-04 LAB — GLUCOSE, CAPILLARY: Glucose-Capillary: 92 mg/dL (ref 70–99)

## 2020-12-04 LAB — ETHANOL: Alcohol, Ethyl (B): <10 mg/dL (ref <10)

## 2020-12-04 LAB — HIV ANTIBODY (ROUTINE TESTING W REFLEX): HIV Screen 4th Generation wRfx: NONREACTIVE

## 2020-12-04 LAB — ABO/RH: ABO/RH(D): B POS

## 2020-12-04 SURGERY — EXPLORATION, ARTERY, FEMORAL
Anesthesia: General | Site: Leg Lower | Laterality: Left

## 2020-12-04 MED ORDER — DOCUSATE SODIUM 50 MG/5ML PO LIQD
100.0000 mg | Freq: Two times a day (BID) | ORAL | Status: DC
  Administered 2020-12-04 – 2020-12-10 (×11): 100 mg
  Filled 2020-12-04 (×12): qty 10

## 2020-12-04 MED ORDER — PANTOPRAZOLE SODIUM 40 MG PO TBEC: 40.0000 mg | DELAYED_RELEASE_TABLET | Freq: Every day | ORAL | Status: DC

## 2020-12-04 MED ORDER — ROCURONIUM BROMIDE 10 MG/ML (PF) SYRINGE
PREFILLED_SYRINGE | INTRAVENOUS | Status: DC | PRN
  Administered 2020-12-04 (×2): 30 mg via INTRAVENOUS
  Administered 2020-12-04: 50 mg via INTRAVENOUS
  Administered 2020-12-04: 30 mg via INTRAVENOUS
  Administered 2020-12-04: 20 mg via INTRAVENOUS

## 2020-12-04 MED ORDER — PHENYLEPHRINE HCL-NACL 10-0.9 MG/250ML-% IV SOLN
INTRAVENOUS | Status: DC | PRN
  Administered 2020-12-04: 50 ug/min via INTRAVENOUS

## 2020-12-04 MED ORDER — FENTANYL 2500MCG IN NS 250ML (10MCG/ML) PREMIX INFUSION: 50.0000 ug/h | INTRAVENOUS | Status: DC

## 2020-12-04 MED ORDER — SODIUM CHLORIDE 0.9 % IV SOLN
500.0000 mL | Freq: Once | INTRAVENOUS | Status: AC | PRN
  Administered 2020-12-04: 500 mL via INTRAVENOUS

## 2020-12-04 MED ORDER — ONDANSETRON HCL 4 MG/2ML IJ SOLN
4.0000 mg | Freq: Four times a day (QID) | INTRAMUSCULAR | Status: DC | PRN
Start: 2020-12-04 — End: 2020-12-11

## 2020-12-04 MED ORDER — PANTOPRAZOLE SODIUM 40 MG IV SOLR
40.0000 mg | Freq: Every day | INTRAVENOUS | Status: DC
  Administered 2020-12-04: 40 mg via INTRAVENOUS
  Filled 2020-12-04: qty 40

## 2020-12-04 MED ORDER — PROPOFOL 1000 MG/100ML IV EMUL
0.0000 ug/kg/min | INTRAVENOUS | Status: DC
Start: 2020-12-04 — End: 2020-12-04
  Filled 2020-12-04: qty 100

## 2020-12-04 MED ORDER — ONDANSETRON HCL 4 MG/2ML IJ SOLN: 4.0000 mg | Freq: Four times a day (QID) | INTRAMUSCULAR | Status: DC | PRN

## 2020-12-04 MED ORDER — ORAL CARE MOUTH RINSE
15.0000 mL | OROMUCOSAL | Status: DC
  Administered 2020-12-04 – 2020-12-05 (×13): 15 mL via OROMUCOSAL

## 2020-12-04 MED ORDER — HEPARIN SODIUM (PORCINE) 1000 UNIT/ML IJ SOLN
INTRAMUSCULAR | Status: AC
  Filled 2020-12-04: qty 3

## 2020-12-04 MED ORDER — DOCUSATE SODIUM 100 MG PO CAPS: 100.0000 mg | ORAL_CAPSULE | Freq: Every day | ORAL | Status: DC

## 2020-12-04 MED ORDER — PROTAMINE SULFATE 10 MG/ML IV SOLN
INTRAVENOUS | Status: AC
  Filled 2020-12-04: qty 10

## 2020-12-04 MED ORDER — SODIUM CHLORIDE 0.9 % IV SOLN: INTRAVENOUS | Status: DC | PRN

## 2020-12-04 MED ORDER — LACTATED RINGERS IV SOLN: INTRAVENOUS | Status: DC

## 2020-12-04 MED ORDER — POLYETHYLENE GLYCOL 3350 17 G PO PACK
17.0000 g | PACK | Freq: Every day | ORAL | Status: DC
  Administered 2020-12-04 – 2020-12-11 (×7): 17 g
  Filled 2020-12-04 (×7): qty 1

## 2020-12-04 MED ORDER — PROTAMINE SULFATE 10 MG/ML IV SOLN
INTRAVENOUS | Status: DC | PRN
  Administered 2020-12-04 (×2): 50 mg via INTRAVENOUS

## 2020-12-04 MED ORDER — ACETAMINOPHEN 325 MG PO TABS: 325.0000 mg | ORAL_TABLET | ORAL | Status: DC | PRN

## 2020-12-04 MED ORDER — PANTOPRAZOLE SODIUM 40 MG PO PACK
40.0000 mg | PACK | Freq: Every day | ORAL | Status: DC
  Administered 2020-12-05 – 2020-12-08 (×2): 40 mg
  Filled 2020-12-04 (×4): qty 20

## 2020-12-04 MED ORDER — FENTANYL BOLUS VIA INFUSION
50.0000 ug | INTRAVENOUS | Status: DC | PRN
  Filled 2020-12-04: qty 100

## 2020-12-04 MED ORDER — ACETAMINOPHEN 650 MG RE SUPP: 325.0000 mg | RECTAL | Status: DC | PRN

## 2020-12-04 MED ORDER — 0.9 % SODIUM CHLORIDE (POUR BTL) OPTIME
TOPICAL | Status: DC | PRN
  Administered 2020-12-04: 2000 mL

## 2020-12-04 MED ORDER — CEFAZOLIN SODIUM-DEXTROSE 2-3 GM-%(50ML) IV SOLR
INTRAVENOUS | Status: DC | PRN
  Administered 2020-12-04: 2 g via INTRAVENOUS

## 2020-12-04 MED ORDER — ONDANSETRON 4 MG PO TBDP: 4.0000 mg | ORAL_TABLET | Freq: Four times a day (QID) | ORAL | Status: DC | PRN

## 2020-12-04 MED ORDER — CEFAZOLIN SODIUM-DEXTROSE 2-4 GM/100ML-% IV SOLN
2.0000 g | Freq: Three times a day (TID) | INTRAVENOUS | Status: AC
  Administered 2020-12-04 (×2): 2 g via INTRAVENOUS
  Filled 2020-12-04 (×2): qty 100

## 2020-12-04 MED ORDER — ALBUMIN HUMAN 5 % IV SOLN
25.0000 g | Freq: Once | INTRAVENOUS | Status: AC
  Administered 2020-12-04: 25 g via INTRAVENOUS
  Filled 2020-12-04: qty 500

## 2020-12-04 MED ORDER — CHLORHEXIDINE GLUCONATE CLOTH 2 % EX PADS
6.0000 | MEDICATED_PAD | Freq: Every day | CUTANEOUS | Status: DC
  Administered 2020-12-04 – 2020-12-09 (×5): 6 via TOPICAL

## 2020-12-04 MED ORDER — CHLORHEXIDINE GLUCONATE 0.12% ORAL RINSE (MEDLINE KIT)
15.0000 mL | Freq: Two times a day (BID) | OROMUCOSAL | Status: DC
  Administered 2020-12-04 – 2020-12-05 (×3): 15 mL via OROMUCOSAL

## 2020-12-04 MED ORDER — ASPIRIN 81 MG PO CHEW
81.0000 mg | CHEWABLE_TABLET | Freq: Every day | ORAL | Status: DC
  Administered 2020-12-05 – 2020-12-11 (×6): 81 mg
  Filled 2020-12-04 (×6): qty 1

## 2020-12-04 MED ORDER — MAGNESIUM SULFATE 2 GM/50ML IV SOLN
2.0000 g | Freq: Every day | INTRAVENOUS | Status: DC | PRN
  Filled 2020-12-04: qty 50

## 2020-12-04 MED ORDER — FENTANYL CITRATE (PF) 100 MCG/2ML IJ SOLN: 50.0000 ug | Freq: Once | INTRAMUSCULAR | Status: DC

## 2020-12-04 MED ORDER — PHENOL 1.4 % MT LIQD: 1.0000 | OROMUCOSAL | Status: DC | PRN

## 2020-12-04 MED ORDER — LACTATED RINGERS IV SOLN
INTRAVENOUS | Status: DC | PRN
  Administered 2020-12-04: 1000 mL via INTRAVENOUS

## 2020-12-04 MED ORDER — HEPARIN SODIUM (PORCINE) 1000 UNIT/ML IJ SOLN
INTRAMUSCULAR | Status: DC | PRN
  Administered 2020-12-04 (×2): 8000 [IU] via INTRAVENOUS

## 2020-12-04 SURGICAL SUPPLY — 62 items
ADH SKN CLS APL DERMABOND .7 (GAUZE/BANDAGES/DRESSINGS)
BANDAGE ESMARK 6X9 LF (GAUZE/BANDAGES/DRESSINGS) IMPLANT
BNDG CMPR 9X6 STRL LF SNTH (GAUZE/BANDAGES/DRESSINGS)
BNDG ESMARK 6X9 LF (GAUZE/BANDAGES/DRESSINGS)
CANISTER SUCT 3000ML PPV (MISCELLANEOUS) ×3 IMPLANT
CANNULA VESSEL 3MM 2 BLNT TIP (CANNULA) ×2 IMPLANT
CATH EMB 4FR 80CM (CATHETERS) ×1 IMPLANT
CLIP VESOCCLUDE MED 24/CT (CLIP) ×4 IMPLANT
CLIP VESOCCLUDE SM WIDE 24/CT (CLIP) ×4 IMPLANT
CONNECTOR Y ATS VAC SYSTEM (MISCELLANEOUS) ×1 IMPLANT
COVER WAND RF STERILE (DRAPES) ×3 IMPLANT
CUFF TOURN SGL QUICK 24 (TOURNIQUET CUFF)
CUFF TOURN SGL QUICK 34 (TOURNIQUET CUFF)
CUFF TOURN SGL QUICK 42 (TOURNIQUET CUFF) IMPLANT
CUFF TRNQT CYL 24X4X16.5-23 (TOURNIQUET CUFF) IMPLANT
CUFF TRNQT CYL 34X4.125X (TOURNIQUET CUFF) IMPLANT
DECANTER SPIKE VIAL GLASS SM (MISCELLANEOUS) IMPLANT
DERMABOND ADVANCED (GAUZE/BANDAGES/DRESSINGS)
DERMABOND ADVANCED .7 DNX12 (GAUZE/BANDAGES/DRESSINGS) IMPLANT
DRAIN HEMOVAC 1/8 X 5 (WOUND CARE) IMPLANT
DRAPE X-RAY CASS 24X20 (DRAPES) IMPLANT
DRSG COVADERM 4X8 (GAUZE/BANDAGES/DRESSINGS) ×2 IMPLANT
DRSG VAC ATS MED SENSATRAC (GAUZE/BANDAGES/DRESSINGS) ×1 IMPLANT
ELECT REM PT RETURN 9FT ADLT (ELECTROSURGICAL) ×3
ELECTRODE REM PT RTRN 9FT ADLT (ELECTROSURGICAL) ×2 IMPLANT
EVACUATOR SILICONE 100CC (DRAIN) IMPLANT
GAUZE 4X4 16PLY RFD (DISPOSABLE) ×1 IMPLANT
GLOVE BIO SURGEON STRL SZ7.5 (GLOVE) ×3 IMPLANT
GLOVE SURG ENC MOIS LTX SZ6.5 (GLOVE) ×3 IMPLANT
GLOVE SURG ENC MOIS LTX SZ7.5 (GLOVE) ×1 IMPLANT
GLOVE SURG UNDER POLY LF SZ7 (GLOVE) ×3 IMPLANT
GOWN STRL REUS W/ TWL LRG LVL3 (GOWN DISPOSABLE) ×6 IMPLANT
GOWN STRL REUS W/TWL LRG LVL3 (GOWN DISPOSABLE) ×9
KIT BASIN OR (CUSTOM PROCEDURE TRAY) ×3 IMPLANT
KIT TURNOVER KIT B (KITS) ×3 IMPLANT
NS IRRIG 1000ML POUR BTL (IV SOLUTION) ×6 IMPLANT
PACK PERIPHERAL VASCULAR (CUSTOM PROCEDURE TRAY) ×3 IMPLANT
PAD ARMBOARD 7.5X6 YLW CONV (MISCELLANEOUS) ×6 IMPLANT
PAD NEG PRESSURE SENSATRAC (MISCELLANEOUS) ×1 IMPLANT
SET COLLECT BLD 21X3/4 12 (NEEDLE) IMPLANT
SPONGE SURGIFOAM ABS GEL 100 (HEMOSTASIS) IMPLANT
STAPLER VISISTAT 35W (STAPLE) ×2 IMPLANT
STOPCOCK 4 WAY LG BORE MALE ST (IV SETS) IMPLANT
SUT PROLENE 5 0 C 1 24 (SUTURE) ×6 IMPLANT
SUT PROLENE 6 0 CC (SUTURE) ×7 IMPLANT
SUT PROLENE 7 0 BV 1 (SUTURE) IMPLANT
SUT PROLENE 7 0 BV1 MDA (SUTURE) IMPLANT
SUT SILK 2 0 SH (SUTURE) ×3 IMPLANT
SUT SILK 3 0 (SUTURE) ×3
SUT SILK 3-0 18XBRD TIE 12 (SUTURE) IMPLANT
SUT VIC AB 2-0 CTX 36 (SUTURE) ×6 IMPLANT
SUT VIC AB 2-0 SH 27 (SUTURE) ×6
SUT VIC AB 2-0 SH 27XBRD (SUTURE) IMPLANT
SUT VIC AB 3-0 SH 27 (SUTURE) ×15
SUT VIC AB 3-0 SH 27X BRD (SUTURE) ×4 IMPLANT
SYR 3ML LL SCALE MARK (SYRINGE) ×1 IMPLANT
TAPE UMBILICAL COTTON 1/8X30 (MISCELLANEOUS) IMPLANT
TOWEL GREEN STERILE (TOWEL DISPOSABLE) ×3 IMPLANT
TRAY FOLEY MTR SLVR 16FR STAT (SET/KITS/TRAYS/PACK) ×3 IMPLANT
TUBING EXTENTION W/L.L. (IV SETS) IMPLANT
UNDERPAD 30X36 HEAVY ABSORB (UNDERPADS AND DIAPERS) ×3 IMPLANT
WATER STERILE IRR 1000ML POUR (IV SOLUTION) ×3 IMPLANT

## 2020-12-04 NOTE — Transfer of Care (Signed)
Immediate Anesthesia Transfer of Care Note  Patient: Arthur James  Procedure(s) Performed: FEMORAL ARTERY EXPLORATION, Repair of Left Superficial Femoral Artery using right Saphenous ven. (Left Groin) Four Compartment FASCIOTOMY (Left Leg Lower) APPLICATION OF WOUND VAC (Left Leg Lower)  Patient Location: NICU  Anesthesia Type:General  Level of Consciousness: Patient remains intubated per anesthesia plan  Airway & Oxygen Therapy: Patient placed on Ventilator (see vital sign flow sheet for setting)  Post-op Assessment: Report given to RN and Post -op Vital signs reviewed and stable  Post vital signs: Reviewed and stable  Last Vitals:  Vitals Value Taken Time  BP 118/89 12/04/20 0431  Temp    Pulse 87 12/04/20 0436  Resp 15 12/04/20 0436  SpO2 100 % 12/04/20 0436  Vitals shown include unvalidated device data.  Last Pain:  Vitals:   12/03/20 2310  TempSrc: Temporal         Complications: No complications documented.

## 2020-12-04 NOTE — Progress Notes (Incomplete)
  Progress Note    12/04/2020 9:29 AM Day of Surgery  Subjective:  Intubated/sedated  Tm 99.1  Vitals:   12/04/20 0800 12/04/20 0900  BP:  93/66  Pulse: (!) 105 98  Resp: 17 11  Temp: (!) 97.5 F (36.4 C)   SpO2:  100%    Physical Exam: Lungs:  Intubated on .01SWF0 Incisions:  Dressings removed from bilateral groins.  Scant bloody drainage from distal portion of left groin incision.  Wound vac with good seal. Extremities:  Brisk biphasic left PT doppler signal with soft left DP doppler signal.   CBC    Component Value Date/Time   WBC 20.8 (H) 12/04/2020 0451   RBC 3.67 (L) 12/04/2020 0451   HGB 11.3 (L) 12/04/2020 0451   HCT 33.3 (L) 12/04/2020 0451   PLT 231 12/04/2020 0451   MCV 90.7 12/04/2020 0451   MCH 30.8 12/04/2020 0451   MCHC 33.9 12/04/2020 0451   RDW 13.4 12/04/2020 0451    BMET    Component Value Date/Time   NA 140 12/04/2020 0451   K 4.0 12/04/2020 0451   CL 108 12/04/2020 0451   CO2 24 12/04/2020 0451   GLUCOSE 97 12/04/2020 0451   BUN 10 12/04/2020 0451   CREATININE 0.92 12/04/2020 0451   CALCIUM 7.6 (L) 12/04/2020 0451   GFRNONAA >60 12/04/2020 0451    INR    Component Value Date/Time   INR 1.1 12/04/2020 0451     Intake/Output Summary (Last 24 hours) at 12/04/2020 0929 Last data filed at 12/04/2020 0900 Gross per 24 hour  Intake 4378.05 ml  Output 1025 ml  Net 3353.05 ml     Assessment:  50 y.o. male is s/p:  Exploration left groin, repair of left superficial femoral artery with vein interposition graft using contralateral reversed greater saphenous vein, thrombectomy left superficial femoral artery, 4 compartment fasciotomy left leg  Day of Surgery  Plan: -pt with brisk left biphasic PT doppler signal and soft left DP doppler signal.   -bandage removed from bilateral groins-staples in tact with scant bloody drainage from the distal portion of the left groin.  Ecchymosis present.  Wound vacs with good seal. May need to go to the  OR for first wound vac change next week -DVT prophylaxis:  Ok to start sq heparin-I will order this   Doreatha Massed, PA-C [facility] [phone] 12/04/2020 9:29 AM
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
## 2020-12-04 NOTE — Progress Notes (Signed)
PT Cancellation Note  Patient Details Name: Arthur James MRN: [id number] DOB: [birth year]   Cancelled Treatment:    Reason Eval/Treat Not Completed: Active bedrest order;Medical issues which prohibited therapy this morning. Pt remains intubated, with soft BP, and with bedrest orders at this time. PT will continue to follow and evaluate as appropriate.   [name], DPT   Acute Rehabilitation Department Pager #: [phone]   [name] 12/04/2020, 12:07 PM

## 2020-12-04 NOTE — Progress Notes (Signed)
Palpable left PT pulse foot warm VAC in place  No obvious bleeding   Patent bypass  Ok to wean to extubate from my standpoint Family updated at bedside  Fabienne Bruns, MD [facility] [place] Office: [phone]

## 2020-12-04 NOTE — ED Notes (Addendum)
Daughter [name] [phone]
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
## 2020-12-04 NOTE — Progress Notes (Signed)
Patient transported to OR without complications.  
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
## 2020-12-04 NOTE — Progress Notes (Signed)
Pt SBP sustaining <100, both PRN bolus given. U/O for past 5 hr . Trauma MD notified and V/O for 25g Albumin.  Aris Lot, RN

## 2020-12-04 NOTE — Progress Notes (Signed)
OT Cancellation Note  Patient Details Name: Arthur James MRN: [id number] DOB: 28-May-1971   Cancelled Treatment:    Reason Eval/Treat Not Completed: Patient not medically ready;Active bedrest order (Pt intubated and with soft BPs.)   Flora Lipps, OTR/L Acute Rehabilitation Services Pager: [phone] Office: [phone]   Yvone Slape C 12/04/2020, 12:09 PM
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
## 2020-12-05 LAB — URINALYSIS, ROUTINE W REFLEX MICROSCOPIC
Bilirubin Urine: NEGATIVE
Glucose, UA: NEGATIVE mg/dL
Hgb urine dipstick: NEGATIVE
Ketones, ur: 20 mg/dL — AB
Leukocytes,Ua: NEGATIVE
Nitrite: NEGATIVE
Protein, ur: NEGATIVE mg/dL
Specific Gravity, Urine: 1.013 (ref 1.005–1.030)
pH: 5 (ref 5.0–8.0)

## 2020-12-05 LAB — TRIGLYCERIDES: Triglycerides: 251 mg/dL — ABNORMAL HIGH (ref <150)

## 2020-12-05 MED ORDER — HYDROMORPHONE HCL 1 MG/ML IJ SOLN
INTRAMUSCULAR | Status: AC
  Administered 2020-12-05: 2 mg via INTRAVENOUS
  Filled 2020-12-05: qty 2

## 2020-12-05 MED ORDER — ACETAMINOPHEN 325 MG PO TABS
650.0000 mg | ORAL_TABLET | ORAL | Status: DC | PRN
  Administered 2020-12-05: 650 mg via ORAL
  Filled 2020-12-05: qty 2

## 2020-12-05 MED ORDER — NALOXONE HCL 0.4 MG/ML IJ SOLN: 0.4000 mg | INTRAMUSCULAR | Status: DC | PRN

## 2020-12-05 MED ORDER — HYDROMORPHONE 1 MG/ML IV SOLN
INTRAVENOUS | Status: AC
  Administered 2020-12-05: 30 mg via INTRAVENOUS
  Administered 2020-12-05: 4.5 mg via INTRAVENOUS
  Administered 2020-12-05: 1.4 mg via INTRAVENOUS
  Administered 2020-12-06: 2.1 mg via INTRAVENOUS
  Administered 2020-12-06 (×2): 2.7 mg via INTRAVENOUS
  Filled 2020-12-05: qty 30

## 2020-12-05 MED ORDER — ACETAMINOPHEN 325 MG PO TABS
325.0000 mg | ORAL_TABLET | ORAL | Status: DC | PRN
  Administered 2020-12-05 (×2): 650 mg
  Filled 2020-12-05 (×2): qty 2

## 2020-12-05 MED ORDER — ORAL CARE MOUTH RINSE
15.0000 mL | Freq: Two times a day (BID) | OROMUCOSAL | Status: DC
  Administered 2020-12-05 – 2020-12-10 (×11): 15 mL via OROMUCOSAL

## 2020-12-05 MED ORDER — SODIUM CHLORIDE 0.9 % IV SOLN
1.0000 g | INTRAVENOUS | Status: AC
  Administered 2020-12-06: 1 g via INTRAVENOUS
  Filled 2020-12-05 (×2): qty 10

## 2020-12-05 MED ORDER — DIPHENHYDRAMINE HCL 50 MG/ML IJ SOLN: 12.5000 mg | Freq: Four times a day (QID) | INTRAMUSCULAR | Status: DC | PRN

## 2020-12-05 MED ORDER — HEPARIN SODIUM (PORCINE) 5000 UNIT/ML IJ SOLN
5000.0000 [IU] | Freq: Three times a day (TID) | INTRAMUSCULAR | Status: AC
  Administered 2020-12-05 (×3): 5000 [IU] via SUBCUTANEOUS
  Filled 2020-12-05 (×3): qty 1

## 2020-12-05 MED ORDER — SODIUM CHLORIDE 0.9% FLUSH: 9.0000 mL | INTRAVENOUS | Status: DC | PRN

## 2020-12-05 MED ORDER — ACETAMINOPHEN 650 MG RE SUPP: 325.0000 mg | RECTAL | Status: DC | PRN

## 2020-12-05 MED ORDER — HYDROMORPHONE HCL 1 MG/ML IJ SOLN
2.0000 mg | Freq: Once | INTRAMUSCULAR | Status: AC
Start: 2020-12-05 — End: 2020-12-05

## 2020-12-05 MED ORDER — DIPHENHYDRAMINE HCL 12.5 MG/5ML PO ELIX
12.5000 mg | ORAL_SOLUTION | Freq: Four times a day (QID) | ORAL | Status: DC | PRN
  Filled 2020-12-05: qty 5

## 2020-12-05 NOTE — Progress Notes (Signed)
PCA pump set-up and patient education provided via translator. Patient resting comfortably and has no questions/concerns at this time.  Aris Lot, RN
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
## 2020-12-05 NOTE — Anesthesia Postprocedure Evaluation (Signed)
Anesthesia Post Note  Patient: Arthur James  Procedure(s) Performed: FEMORAL ARTERY EXPLORATION, Repair of Left Superficial Femoral Artery using right Saphenous ven. (Left Groin) Four Compartment FASCIOTOMY (Left Leg Lower) APPLICATION OF WOUND VAC (Left Leg Lower)     Patient location during evaluation: SICU Anesthesia Type: General Level of consciousness: sedated Pain management: pain level controlled Vital Signs Assessment: post-procedure vital signs reviewed and stable Respiratory status: patient remains intubated per anesthesia plan Cardiovascular status: stable Postop Assessment: no apparent nausea or vomiting Anesthetic complications: no   No complications documented.  Last Vitals:  Vitals:   12/05/20 1355 12/05/20 1544  BP:    Pulse:    Resp: 18 19  Temp:    SpO2: 97% 98%    Last Pain:  Vitals:   12/05/20 1544  TempSrc:   PainSc: Asleep                 Temesha Queener

## 2020-12-05 NOTE — Progress Notes (Signed)
Pt placed on PSV 5/5 per wean protocol. Pt is tolerating well at this time. RT to continue to monitor. 

## 2020-12-05 NOTE — Procedures (Signed)
Extubation Procedure Note  Patient Details:   Name: Arthur James DOB: [birth year] MRN: [id number]   Airway Documentation:    Vent end date: 12/05/20 Vent end time: 1220   Evaluation  O2 sats: stable throughout Complications: No apparent complications Patient did tolerate procedure well. Bilateral Breath Sounds: Clear,Diminished   Pt extubated to 4L [place] per MD order. Pt had positive cuff leak prior to extubation. No stridor noted.  Guss Bunde 12/05/2020, 12:21 PM

## 2020-12-05 NOTE — Progress Notes (Signed)
PT Cancellation Note  Patient Details Name: [name] MRN: [id number] DOB: 27-May-1971   Cancelled Treatment:    Reason Eval/Treat Not Completed: Patient not medically ready;Active bedrest order (Bedrest. Weaning on ventilator).  Lillia Pauls, PT, DPT Acute Rehabilitation Services Pager [phone] Office [phone]    Norval Morton 12/05/2020, 7:51 AM

## 2020-12-06 ENCOUNTER — Inpatient Hospital Stay (HOSPITAL_COMMUNITY): Payer: Medicaid Other | Admitting: Anesthesiology

## 2020-12-06 ENCOUNTER — Encounter (HOSPITAL_COMMUNITY): Admission: EM | Disposition: A | Discharge status: Home / Self Care | Payer: Self-pay | Source: Home / Self Care

## 2020-12-06 ENCOUNTER — Encounter (HOSPITAL_COMMUNITY): Payer: Self-pay | Admitting: General Surgery

## 2020-12-06 ENCOUNTER — Other Ambulatory Visit: Payer: Self-pay

## 2020-12-06 HISTORY — PX: FASCIOTOMY CLOSURE: SHX5829

## 2020-12-06 LAB — CBC
HCT: 22.5 % — ABNORMAL LOW (ref 39.0–52.0)
Hemoglobin: 7.3 g/dL — ABNORMAL LOW (ref 13.0–17.0)
MCH: 30.2 pg (ref 26.0–34.0)
MCHC: 32.4 g/dL (ref 30.0–36.0)
MCV: 93 fL (ref 80.0–100.0)
Platelets: 167 10*3/uL (ref 150–400)
RBC: 2.42 MIL/uL — ABNORMAL LOW (ref 4.22–5.81)
RDW: 12.5 % (ref 11.5–15.5)
WBC: 12 10*3/uL — ABNORMAL HIGH (ref 4.0–10.5)
nRBC: 0 % (ref 0.0–0.2)

## 2020-12-06 LAB — BASIC METABOLIC PANEL
Anion gap: 8 (ref 5–15)
BUN: 6 mg/dL (ref 6–20)
CO2: 23 mmol/L (ref 22–32)
Calcium: 7.8 mg/dL — ABNORMAL LOW (ref 8.9–10.3)
Chloride: 104 mmol/L (ref 98–111)
Creatinine, Ser: 0.8 mg/dL (ref 0.61–1.24)
GFR, Estimated: >60 mL/min (ref >60)
Glucose, Bld: 93 mg/dL (ref 70–99)
Potassium: 3.5 mmol/L (ref 3.5–5.1)
Sodium: 135 mmol/L (ref 135–145)

## 2020-12-06 LAB — PROTIME-INR
INR: 1 (ref 0.8–1.2)
Prothrombin Time: 13.6 seconds (ref 11.4–15.2)

## 2020-12-06 SURGERY — FASCIOTOMY CLOSURE
Anesthesia: General | Site: Leg Lower | Laterality: Left

## 2020-12-06 MED ORDER — OXYCODONE HCL 5 MG/5ML PO SOLN
5.0000 mg | ORAL | Status: DC | PRN
  Administered 2020-12-06 – 2020-12-07 (×3): 10 mg via ORAL
  Administered 2020-12-09 – 2020-12-10 (×2): 5 mg via ORAL
  Filled 2020-12-06 (×2): qty 5
  Filled 2020-12-06: qty 10
  Filled 2020-12-06: qty 5
  Filled 2020-12-06 (×3): qty 10

## 2020-12-06 MED ORDER — DEXAMETHASONE SODIUM PHOSPHATE 10 MG/ML IJ SOLN
INTRAMUSCULAR | Status: AC
  Filled 2020-12-06: qty 1

## 2020-12-06 MED ORDER — 0.9 % SODIUM CHLORIDE (POUR BTL) OPTIME
TOPICAL | Status: DC | PRN
  Administered 2020-12-06: 1000 mL

## 2020-12-06 MED ORDER — CHLORHEXIDINE GLUCONATE 0.12 % MT SOLN: 15.0000 mL | Freq: Once | OROMUCOSAL | Status: AC

## 2020-12-06 MED ORDER — PROTAMINE SULFATE 10 MG/ML IV SOLN
INTRAVENOUS | Status: AC
  Filled 2020-12-06: qty 25

## 2020-12-06 MED ORDER — FENTANYL CITRATE (PF) 250 MCG/5ML IJ SOLN
INTRAMUSCULAR | Status: AC
  Filled 2020-12-06: qty 5

## 2020-12-06 MED ORDER — ALBUMIN HUMAN 5 % IV SOLN: INTRAVENOUS | Status: DC | PRN

## 2020-12-06 MED ORDER — DEXAMETHASONE SODIUM PHOSPHATE 4 MG/ML IJ SOLN: INTRAMUSCULAR | Status: DC | PRN

## 2020-12-06 MED ORDER — KETOROLAC TROMETHAMINE 15 MG/ML IJ SOLN
30.0000 mg | Freq: Four times a day (QID) | INTRAMUSCULAR | Status: AC
  Administered 2020-12-06 – 2020-12-11 (×19): 30 mg via INTRAVENOUS
  Filled 2020-12-06 (×20): qty 2

## 2020-12-06 MED ORDER — FENTANYL CITRATE (PF) 100 MCG/2ML IJ SOLN
25.0000 ug | INTRAMUSCULAR | Status: DC | PRN
  Administered 2020-12-06: 50 ug via INTRAVENOUS

## 2020-12-06 MED ORDER — LACTATED RINGERS IV SOLN: INTRAVENOUS | Status: DC

## 2020-12-06 MED ORDER — ACETAMINOPHEN 10 MG/ML IV SOLN
1000.0000 mg | Freq: Four times a day (QID) | INTRAVENOUS | Status: DC
  Administered 2020-12-06 – 2020-12-07 (×3): 1000 mg via INTRAVENOUS
  Filled 2020-12-06 (×3): qty 100

## 2020-12-06 MED ORDER — ORAL CARE MOUTH RINSE: 15.0000 mL | Freq: Once | OROMUCOSAL | Status: AC

## 2020-12-06 MED ORDER — METHOCARBAMOL 500 MG PO TABS
1000.0000 mg | ORAL_TABLET | Freq: Three times a day (TID) | ORAL | Status: DC
  Administered 2020-12-06 – 2020-12-11 (×15): 1000 mg via ORAL
  Filled 2020-12-06 (×17): qty 2

## 2020-12-06 MED ORDER — DEXAMETHASONE SODIUM PHOSPHATE 10 MG/ML IJ SOLN
INTRAMUSCULAR | Status: DC | PRN
  Administered 2020-12-06: 10 mg via INTRAVENOUS

## 2020-12-06 MED ORDER — ENOXAPARIN SODIUM 30 MG/0.3ML IJ SOSY
30.0000 mg | PREFILLED_SYRINGE | Freq: Two times a day (BID) | INTRAMUSCULAR | Status: DC
  Administered 2020-12-06 – 2020-12-11 (×10): 30 mg via SUBCUTANEOUS
  Filled 2020-12-06 (×10): qty 0.3

## 2020-12-06 MED ORDER — ONDANSETRON HCL 4 MG/2ML IJ SOLN
INTRAMUSCULAR | Status: AC
  Filled 2020-12-06: qty 2

## 2020-12-06 MED ORDER — LIDOCAINE 2% (20 MG/ML) 5 ML SYRINGE
INTRAMUSCULAR | Status: DC | PRN
  Administered 2020-12-06: 80 mg via INTRAVENOUS

## 2020-12-06 MED ORDER — PROPOFOL 10 MG/ML IV BOLUS
INTRAVENOUS | Status: DC | PRN
  Administered 2020-12-06: 150 mg via INTRAVENOUS

## 2020-12-06 MED ORDER — HYDROMORPHONE HCL 1 MG/ML IJ SOLN
1.0000 mg | INTRAMUSCULAR | Status: DC | PRN
  Administered 2020-12-06 (×2): 1 mg via INTRAVENOUS
  Filled 2020-12-06 (×2): qty 1

## 2020-12-06 MED ORDER — CHLORHEXIDINE GLUCONATE 0.12 % MT SOLN
OROMUCOSAL | Status: AC
  Administered 2020-12-06: 15 mL via OROMUCOSAL
  Filled 2020-12-06: qty 15

## 2020-12-06 MED ORDER — MIDAZOLAM HCL 2 MG/2ML IJ SOLN
INTRAMUSCULAR | Status: AC
  Filled 2020-12-06: qty 2

## 2020-12-06 MED ORDER — FENTANYL CITRATE (PF) 100 MCG/2ML IJ SOLN
INTRAMUSCULAR | Status: AC
  Filled 2020-12-06: qty 2

## 2020-12-06 MED ORDER — FENTANYL CITRATE (PF) 100 MCG/2ML IJ SOLN
INTRAMUSCULAR | Status: DC | PRN
  Administered 2020-12-06 (×2): 50 ug via INTRAVENOUS

## 2020-12-06 MED ORDER — PHENYLEPHRINE 40 MCG/ML (10ML) SYRINGE FOR IV PUSH (FOR BLOOD PRESSURE SUPPORT)
PREFILLED_SYRINGE | INTRAVENOUS | Status: DC | PRN
  Administered 2020-12-06: 40 ug via INTRAVENOUS
  Administered 2020-12-06: 80 ug via INTRAVENOUS
  Administered 2020-12-06 (×2): 40 ug via INTRAVENOUS

## 2020-12-06 MED ORDER — PROMETHAZINE HCL 25 MG/ML IJ SOLN: 6.2500 mg | INTRAMUSCULAR | Status: DC | PRN

## 2020-12-06 MED ORDER — ONDANSETRON HCL 4 MG/2ML IJ SOLN
INTRAMUSCULAR | Status: DC | PRN
  Administered 2020-12-06: 4 mg via INTRAVENOUS

## 2020-12-06 SURGICAL SUPPLY — 28 items
CANISTER SUCT 3000ML PPV (MISCELLANEOUS) ×3 IMPLANT
COVER SURGICAL LIGHT HANDLE (MISCELLANEOUS) ×3 IMPLANT
COVER WAND RF STERILE (DRAPES) IMPLANT
DRSG COVADERM 4X10 (GAUZE/BANDAGES/DRESSINGS) ×3 IMPLANT
DRSG COVADERM 4X8 (GAUZE/BANDAGES/DRESSINGS) ×3 IMPLANT
ELECT REM PT RETURN 9FT ADLT (ELECTROSURGICAL) ×3
ELECTRODE REM PT RTRN 9FT ADLT (ELECTROSURGICAL) ×1 IMPLANT
GAUZE SPONGE 4X4 12PLY STRL (GAUZE/BANDAGES/DRESSINGS) ×3 IMPLANT
GLOVE BIO SURGEON STRL SZ7.5 (GLOVE) ×3 IMPLANT
GLOVE SRG 8 PF TXTR STRL LF DI (GLOVE) ×1 IMPLANT
GLOVE SURG UNDER POLY LF SZ8 (GLOVE) ×3
GOWN STRL REUS W/ TWL LRG LVL3 (GOWN DISPOSABLE) ×3 IMPLANT
GOWN STRL REUS W/ TWL XL LVL3 (GOWN DISPOSABLE) ×2 IMPLANT
GOWN STRL REUS W/TWL LRG LVL3 (GOWN DISPOSABLE) ×9
GOWN STRL REUS W/TWL XL LVL3 (GOWN DISPOSABLE) ×6
KIT BASIN OR (CUSTOM PROCEDURE TRAY) ×3 IMPLANT
KIT TURNOVER KIT B (KITS) ×3 IMPLANT
NS IRRIG 1000ML POUR BTL (IV SOLUTION) ×3 IMPLANT
PACK GENERAL/GYN (CUSTOM PROCEDURE TRAY) ×3 IMPLANT
PACK UNIVERSAL I (CUSTOM PROCEDURE TRAY) ×3 IMPLANT
PAD ARMBOARD 7.5X6 YLW CONV (MISCELLANEOUS) ×6 IMPLANT
SUT ETHILON 2 0 PSLX (SUTURE) ×18 IMPLANT
SUT MNCRL AB 4-0 PS2 18 (SUTURE) IMPLANT
SUT VIC AB 2-0 CTX 36 (SUTURE) IMPLANT
SUT VIC AB 3-0 SH 27 (SUTURE)
SUT VIC AB 3-0 SH 27X BRD (SUTURE) IMPLANT
TOWEL GREEN STERILE (TOWEL DISPOSABLE) ×3 IMPLANT
WATER STERILE IRR 1000ML POUR (IV SOLUTION) ×3 IMPLANT
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
## 2020-12-06 NOTE — Anesthesia Procedure Notes (Signed)
Procedure Name: LMA Insertion Date/Time: 12/06/2020 1:29 PM Performed by: Laruth Bouchard., CRNA Pre-anesthesia Checklist: Patient identified, Emergency Drugs available, Suction available and Patient being monitored Patient Re-evaluated:Patient Re-evaluated prior to induction Oxygen Delivery Method: Circle system utilized Preoxygenation: Pre-oxygenation with 100% oxygen Induction Type: IV induction LMA: LMA inserted LMA Size: 4.0 Tube type: Oral Number of attempts: 1 Placement Confirmation: breath sounds checked- equal and bilateral and positive ETCO2 Tube secured with: Tape Dental Injury: Teeth and Oropharynx as per pre-operative assessment
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
## 2020-12-06 NOTE — Progress Notes (Signed)
Inpatient Rehab Admissions Coordinator Note:   Per therapy recommendations, pt was screened for CIR candidacy by Estill Dooms, PT, DPT. Note therapy evaluations limited by pain.  Will follow from a distance for improved pain and tolerance with therapy.  No consult just yet.   Please contact me with questions.   Estill Dooms, PT, DPT [phone] 12/06/20 4:36 PM

## 2020-12-06 NOTE — Transfer of Care (Signed)
Immediate Anesthesia Transfer of Care Note  Patient: Arthur James  Procedure(s) Performed: FASCIOTOMY LEFT LEG CLOSURE AND WASH OUT (Left Leg Lower)  Patient Location: PACU  Anesthesia Type:General  Level of Consciousness: awake and drowsy  Airway & Oxygen Therapy: Patient Spontanous Breathing and Patient connected to nasal cannula oxygen  Post-op Assessment: Report given to RN and Post -op Vital signs reviewed and stable  Post vital signs: Reviewed and stable  Last Vitals:  Vitals Value Taken Time  BP 97/68   Temp    Pulse 86   Resp 15   SpO2 98     Last Pain:  Vitals:   12/06/20 1203  TempSrc: Oral  PainSc:       Patients Stated Pain Goal: 2 (12/05/20 1845)  Complications: No complications documented.
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
## 2020-12-07 ENCOUNTER — Encounter (HOSPITAL_COMMUNITY): Payer: Self-pay | Admitting: Vascular Surgery

## 2020-12-07 LAB — TYPE AND SCREEN
ABO/RH(D): B POS
Antibody Screen: NEGATIVE
Unit division: 0
Unit division: 0
Unit division: 0
Unit division: 0
Unit division: 0
Unit division: 0
Unit division: 0
Unit division: 0
Unit division: 0
Unit division: 0
Unit division: 0
Unit division: 0
Unit division: 0
Unit division: 0
Unit division: 0
Unit division: 0
Unit division: 0
Unit division: 0

## 2020-12-07 LAB — BPAM RBC
Blood Product Expiration Date: 202205232359
Blood Product Expiration Date: 202205242359
Blood Product Expiration Date: 202205262359
Blood Product Expiration Date: 202205262359
Blood Product Expiration Date: 202205262359
Blood Product Expiration Date: 202205262359
Blood Product Expiration Date: 202206042359
Blood Product Expiration Date: 202206042359
Blood Product Expiration Date: 202206052359
Blood Product Expiration Date: 202206052359
Blood Product Expiration Date: 202206052359
Blood Product Expiration Date: 202206052359
Blood Product Expiration Date: 202206052359
Blood Product Expiration Date: 202206052359
Blood Product Expiration Date: 202206052359
Blood Product Expiration Date: 202206052359
Blood Product Expiration Date: 202206052359
Blood Product Expiration Date: 202206052359
ISSUE DATE / TIME: 202205062306
ISSUE DATE / TIME: 202205062306
ISSUE DATE / TIME: 202205062306
ISSUE DATE / TIME: 202205062306
ISSUE DATE / TIME: 202205062311
ISSUE DATE / TIME: 202205062311
ISSUE DATE / TIME: 202205062312
ISSUE DATE / TIME: 202205062316
ISSUE DATE / TIME: 202205062323
ISSUE DATE / TIME: 202205062323
ISSUE DATE / TIME: 202205091306
ISSUE DATE / TIME: 202205091909
ISSUE DATE / TIME: 202205092108
ISSUE DATE / TIME: 202205092137
ISSUE DATE / TIME: 202205100011
ISSUE DATE / TIME: 202205100059
Unit Type and Rh: 5100
Unit Type and Rh: 5100
Unit Type and Rh: 5100
Unit Type and Rh: 5100
Unit Type and Rh: 5100
Unit Type and Rh: 5100
Unit Type and Rh: 5100
Unit Type and Rh: 5100
Unit Type and Rh: 5100
Unit Type and Rh: 5100
Unit Type and Rh: 5100
Unit Type and Rh: 5100
Unit Type and Rh: 5100
Unit Type and Rh: 5100
Unit Type and Rh: 7300
Unit Type and Rh: 7300
Unit Type and Rh: 7300
Unit Type and Rh: 7300

## 2020-12-07 LAB — TROPONIN I (HIGH SENSITIVITY)
Troponin I (High Sensitivity): 6 ng/L (ref <18)
Troponin I (High Sensitivity): 5 ng/L (ref <18)

## 2020-12-07 LAB — BASIC METABOLIC PANEL
Anion gap: 8 (ref 5–15)
BUN: 9 mg/dL (ref 6–20)
CO2: 25 mmol/L (ref 22–32)
Calcium: 8.3 mg/dL — ABNORMAL LOW (ref 8.9–10.3)
Chloride: 106 mmol/L (ref 98–111)
Creatinine, Ser: 0.77 mg/dL (ref 0.61–1.24)
GFR, Estimated: >60 mL/min (ref >60)
Glucose, Bld: 168 mg/dL — ABNORMAL HIGH (ref 70–99)
Potassium: 3.7 mmol/L (ref 3.5–5.1)
Sodium: 139 mmol/L (ref 135–145)

## 2020-12-07 LAB — CBC
HCT: 20.9 % — ABNORMAL LOW (ref 39.0–52.0)
Hemoglobin: 7.2 g/dL — ABNORMAL LOW (ref 13.0–17.0)
MCH: 30.9 pg (ref 26.0–34.0)
MCHC: 34.4 g/dL (ref 30.0–36.0)
MCV: 89.7 fL (ref 80.0–100.0)
Platelets: 210 10*3/uL (ref 150–400)
RBC: 2.33 MIL/uL — ABNORMAL LOW (ref 4.22–5.81)
RDW: 12.2 % (ref 11.5–15.5)
WBC: 8.6 10*3/uL (ref 4.0–10.5)
nRBC: 0 % (ref 0.0–0.2)

## 2020-12-07 MED ORDER — HALOPERIDOL LACTATE 5 MG/ML IJ SOLN: 5.0000 mg | Freq: Four times a day (QID) | INTRAMUSCULAR | Status: DC | PRN

## 2020-12-07 MED ORDER — ACETAMINOPHEN 325 MG PO TABS
650.0000 mg | ORAL_TABLET | Freq: Four times a day (QID) | ORAL | Status: DC
  Administered 2020-12-07 – 2020-12-11 (×15): 650 mg via ORAL
  Filled 2020-12-07 (×16): qty 2

## 2020-12-07 MED ORDER — LORAZEPAM 2 MG/ML IJ SOLN
1.0000 mg | Freq: Four times a day (QID) | INTRAMUSCULAR | Status: DC | PRN
  Administered 2020-12-07: 1 mg via INTRAVENOUS

## 2020-12-07 MED ORDER — KCL IN DEXTROSE-NACL 20-5-0.45 MEQ/L-%-% IV SOLN
INTRAVENOUS | Status: DC
  Filled 2020-12-07 (×3): qty 1000

## 2020-12-07 MED ORDER — QUETIAPINE FUMARATE 50 MG PO TABS
25.0000 mg | ORAL_TABLET | Freq: Two times a day (BID) | ORAL | Status: DC
  Administered 2020-12-07 – 2020-12-11 (×8): 25 mg via ORAL
  Filled 2020-12-07 (×9): qty 1

## 2020-12-07 MED ORDER — ENSURE ENLIVE PO LIQD
237.0000 mL | Freq: Three times a day (TID) | ORAL | Status: DC
  Administered 2020-12-07 – 2020-12-10 (×9): 237 mL via ORAL

## 2020-12-07 MED ORDER — HYDROMORPHONE HCL 1 MG/ML IJ SOLN
0.5000 mg | INTRAMUSCULAR | Status: DC | PRN
  Administered 2020-12-07 (×2): 0.5 mg via INTRAVENOUS
  Filled 2020-12-07 (×2): qty 1

## 2020-12-07 MED ORDER — HALOPERIDOL LACTATE 5 MG/ML IJ SOLN
5.0000 mg | Freq: Once | INTRAMUSCULAR | Status: AC
  Administered 2020-12-07: 5 mg via INTRAVENOUS
  Filled 2020-12-07: qty 1

## 2020-12-07 MED ORDER — LORAZEPAM 2 MG/ML IJ SOLN: 1.0000 mg | INTRAMUSCULAR | Status: DC | PRN

## 2020-12-07 MED ORDER — LORAZEPAM 2 MG/ML IJ SOLN
INTRAMUSCULAR | Status: AC
  Filled 2020-12-07: qty 1

## 2020-12-07 NOTE — Progress Notes (Signed)
Pt sleeping at this time, wife at bedside. HR per heart monitor 97 bpm

## 2020-12-07 NOTE — Progress Notes (Addendum)
  Progress Note    12/07/2020 7:11 AM 1 Day Post-Op  Subjective:  Sleeping-wakes easily.  When asked, he wiggles his toes and some flexion at the ankle.  Says yes when asked if he can feel me touching the dorsum of his foot.   afebrile  Vitals:   12/07/20 0219 12/07/20 0610  BP: 108/69 106/66  Pulse: 83 75  Resp: 16 15  Temp: 97.7 F (36.5 C) 97.6 F (36.4 C)  SpO2: 96% 100%    Physical Exam: Cardiac:  regular Lungs:  Non labored Incisions:  Bilateral groins clean with staples in tact; fasciotomy incisions are covered with mild bloody drainage.  Extremities:  Easily palpable left PT pulse.  Able to move foot and ankle more today and sensation in tact.   CBC    Component Value Date/Time   WBC 8.6 12/07/2020 0228   RBC 2.33 (L) 12/07/2020 0228   HGB 7.2 (L) 12/07/2020 0228   HCT 20.9 (L) 12/07/2020 0228   PLT 210 12/07/2020 0228   MCV 89.7 12/07/2020 0228   MCH 30.9 12/07/2020 0228   MCHC 34.4 12/07/2020 0228   RDW 12.2 12/07/2020 0228    BMET    Component Value Date/Time   NA 139 12/07/2020 0228   K 3.7 12/07/2020 0228   CL 106 12/07/2020 0228   CO2 25 12/07/2020 0228   GLUCOSE 168 (H) 12/07/2020 0228   BUN 9 12/07/2020 0228   CREATININE 0.77 12/07/2020 0228   CALCIUM 8.3 (L) 12/07/2020 0228   GFRNONAA >60 12/07/2020 0228    INR    Component Value Date/Time   INR 1.0 12/06/2020 0348     Intake/Output Summary (Last 24 hours) at 12/07/2020 0711 Last data filed at 12/07/2020 0449 Gross per 24 hour  Intake 3431.96 ml  Output 2170 ml  Net 1261.96 ml     Assessment:  50 y.o. male is s/p:  Exploration left groin, repair of left superficial femoral artery with vein interposition graft using contralateral reversed greater saphenous vein, thrombectomy left superficial femoral artery, 4 compartment fasciotomy left leg  3 Days Post-Op And Closure of LLE fasciotomy sites  1 Day Post-Op  Plan: -pt doing well this morning with easily palpable left PT  pulse. -able to move foot more today and sensation more in tact -bilateral groin incisions look good with staples in tact.  -hgb stable from yesterday -pt will need to continue baby asa at discharge -? Statin at dc given elevated cholesterol post op.  Will d/w Dr. Darrick Penna. -DVT prophylaxis:  Lovenox    Doreatha Massed, PA-C [facility] [phone] 12/07/2020 7:11 AM  Agree with above 2+ PT pulse Incisions clean Fasciotomies closed Some left leg movement, language barrier is probably limiting his movement some although certainly may have nerve injury as well Need to [facility] for rehab from my standpoint when pain controlled  Fabienne Bruns, MD [facility] [place] Office: [phone]

## 2020-12-07 NOTE — Progress Notes (Signed)
Pt banging call bell against bedside rail and pulled out of wall.  Call Arabic interpreter via telephone ID [phone].

## 2020-12-07 NOTE — Anesthesia Postprocedure Evaluation (Signed)
Anesthesia Post Note  Patient: Arthur James  Procedure(s) Performed: FASCIOTOMY LEFT LEG CLOSURE AND WASH OUT (Left Leg Lower)     Patient location during evaluation: PACU Anesthesia Type: General Level of consciousness: awake and alert Pain management: pain level controlled Vital Signs Assessment: post-procedure vital signs reviewed and stable Respiratory status: spontaneous breathing, nonlabored ventilation, respiratory function stable and patient connected to nasal cannula oxygen Cardiovascular status: blood pressure returned to baseline and stable Postop Assessment: no apparent nausea or vomiting Anesthetic complications: no   No complications documented.  Last Vitals:  Vitals:   12/07/20 0219 12/07/20 0610  BP: 108/69 106/66  Pulse: 83 75  Resp: 16 15  Temp: 36.5 C 36.4 C  SpO2: 96% 100%    Last Pain:  Vitals:   12/07/20 0610  TempSrc: Axillary  PainSc:                  Cecile Hearing
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
## 2020-12-07 NOTE — Progress Notes (Signed)
Text Dr Doylene Canard and she called back.  Notified provider Pt HR and RR were elevated.  Per doctor, continue monitoring.  In addition notified provider Pt had urinated very minimal past 6-7 hours and and per bladder scanner he had .  Per provider I and O cath.

## 2020-12-07 NOTE — Progress Notes (Signed)
PT Cancellation Note  Patient Details Name: Arthur James MRN: [id number] DOB: 1970/11/05   Cancelled Treatment:    Reason Eval/Treat Not Completed: Medical issues which prohibited therapy - pt having acute chest pain, RN in room assessing via EKG. PT to check back per RN request.  Marye Round, PT DPT Acute Rehabilitation Services Pager [phone]  Office [phone]    Tyrone Apple E Christain [place] 12/07/2020, 10:57 AM

## 2020-12-07 NOTE — Plan of Care (Signed)
°  Problem: Education: °Goal: Knowledge of General Education information will improve °Description: Including pain rating scale, medication(s)/side effects and non-pharmacologic comfort measures °Outcome: Progressing °  °Problem: Clinical Measurements: °Goal: Diagnostic test results will improve °Outcome: Progressing °  °Problem: Clinical Measurements: °Goal: Ability to maintain clinical measurements within normal limits will improve °Outcome: Progressing °  °
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
## 2020-12-07 NOTE — Progress Notes (Signed)
Pt MEWS, Pt was agitated and wanted his wife in the room.  Pt arrived 10-15 minutes later.   [name] PA notified of agitation, HR and BP.  New orders entered by [facility] PA    12/07/20 1453  Assess: MEWS Score  Temp 98.1 F (36.7 C)  BP (!) 146/78  Pulse Rate (!) 118  ECG Heart Rate (!) 110  Resp 16  Level of Consciousness Alert  SpO2 98 %  O2 Device Room Air  Assess: MEWS Score  MEWS Temp 0  MEWS Systolic 0  MEWS Pulse 1  MEWS RR 0  MEWS LOC 0  MEWS Score 1  MEWS Score Color Green  Assess: if the MEWS score is Yellow or Red  Were vital signs taken at a resting state? No  Focused Assessment Change from prior assessment (see assessment flowsheet)  Early Detection of Sepsis Score *See Row Information* Low  MEWS guidelines implemented *See Row Information* No, other (Comment) (Pt was agitated.)  Treat  MEWS Interventions Administered scheduled meds/treatments (Haldol)  Pain Scale Faces  Faces Pain Scale 4  Pain Type Surgical pain  Pain Location Groin  Pain Orientation Lateral

## 2020-12-07 NOTE — Discharge Instructions (Signed)
 [facility] [place]  Discharge instructions  Lower Extremity Bypass Surgery  Please refer to the following instruction for your post-procedure care. Your surgeon or physician assistant will discuss any changes with you.  Activity  You are encouraged to walk as much as you can. You can slowly return to normal activities during the month after your surgery. Avoid strenuous activity and heavy lifting until your doctor tells you it's OK. Avoid activities such as vacuuming or swinging a golf club. Do not drive until your doctor give the OK and you are no longer taking prescription pain medications. It is also normal to have difficulty with sleep habits, eating and bowel movement after surgery. These will go away with time.  Bathing/Showering  Shower daily after you go home. Do not soak in a bathtub, hot tub, or swim until the incision heals completely.  Incision Care  Clean your incision with mild soap and water. Shower every day. Pat the area dry with a clean towel. You do not need a bandage unless otherwise instructed. Do not apply any ointments or creams to your incision. If you have open wounds you will be instructed how to care for them or a visiting nurse may be arranged for you. If you have staples or sutures along your incision they will be removed at your post-op appointment. You may have skin glue on your incision. Do not peel it off. It will come off on its own in about one week.  Wash the groin wound with soap and water daily and pat dry. (No tub bath-only shower)  Then put a dry gauze or washcloth in the groin to keep this area dry to help prevent wound infection.  Do this daily and as needed.  Do not use Vaseline or neosporin on your incisions.  Only use soap and water on your incisions and then protect and keep dry.  Diet  Resume your normal diet. There are no special food restrictions following this procedure. A low fat/ low cholesterol diet is  recommended for all patients with vascular disease. In order to heal from your surgery, it is CRITICAL to get adequate nutrition. Your body requires vitamins, minerals, and protein. Vegetables are the best source of vitamins and minerals. Vegetables also provide the perfect balance of protein. Processed food has little nutritional value, so try to avoid this.  Medications  Resume taking all your medications unless your doctor or physician assistant tells you not to. If your incision is causing pain, you may take over-the-counter pain relievers such as acetaminophen (Tylenol). If you were prescribed a stronger pain medication, please aware these medication can cause nausea and constipation. Prevent nausea by taking the medication with a snack or meal. Avoid constipation by drinking plenty of fluids and eating foods with high amount of fiber, such as fruits, vegetables, and grains. Take Colace 100 mg (an over-the-counter stool softener) twice a day as needed for constipation.  Do not take Tylenol if you are taking prescription pain medications.  Follow Up  Our office will schedule a follow up appointment 2-3 weeks following discharge.  Please call us immediately for any of the following conditions  Severe or worsening pain in your legs or feet while at rest or while walking Increase pain, redness, warmth, or drainage (pus) from your incision site(s) Fever of 101 degree or higher The swelling in your leg with the bypass suddenly worsens and becomes more painful than when you were in the hospital If you have   been instructed to feel your graft pulse then you should do so every day. If you can no longer feel this pulse, call the office immediately. Not all patients are given this instruction.  Leg swelling is common after leg bypass surgery.  The swelling should improve over a few months following surgery. To improve the swelling, you may elevate your legs above the level of your heart while you are  sitting or resting. Your surgeon or physician assistant may ask you to apply an ACE wrap or wear compression (TED) stockings to help to reduce swelling.  Reduce your risk of vascular disease  Stop smoking. If you would like help call [facility] at 1-800-QUIT-NOW (1-800-784-8669) or [place] at 336-586-4000.  Manage your cholesterol Maintain a desired weight Control your diabetes weight Control your diabetes Keep your blood pressure down  If you have any questions, please call the office at [phone]  

## 2020-12-07 NOTE — Progress Notes (Signed)
Pt got very agitated ( asleep now ) and stated via interpreter he does not want to live anymore, he wanted a medication to go to sleep and die.  He also stated he does not want to talk anymore and I was unable to get anymore out of him.  Provider Bretta Bang PA notified via secured chat.

## 2020-12-08 DIAGNOSIS — F439 Reaction to severe stress, unspecified: Secondary | ICD-10-CM

## 2020-12-08 LAB — BASIC METABOLIC PANEL
Anion gap: 8 (ref 5–15)
BUN: 13 mg/dL (ref 6–20)
CO2: 24 mmol/L (ref 22–32)
Calcium: 7.9 mg/dL — ABNORMAL LOW (ref 8.9–10.3)
Chloride: 109 mmol/L (ref 98–111)
Creatinine, Ser: 0.79 mg/dL (ref 0.61–1.24)
GFR, Estimated: >60 mL/min (ref >60)
Glucose, Bld: 104 mg/dL — ABNORMAL HIGH (ref 70–99)
Potassium: 3.3 mmol/L — ABNORMAL LOW (ref 3.5–5.1)
Sodium: 141 mmol/L (ref 135–145)

## 2020-12-08 LAB — CBC
HCT: 19.9 % — ABNORMAL LOW (ref 39.0–52.0)
Hemoglobin: 6.7 g/dL — CL (ref 13.0–17.0)
MCH: 31 pg (ref 26.0–34.0)
MCHC: 33.7 g/dL (ref 30.0–36.0)
MCV: 92.1 fL (ref 80.0–100.0)
Platelets: 300 10*3/uL (ref 150–400)
RBC: 2.16 MIL/uL — ABNORMAL LOW (ref 4.22–5.81)
RDW: 12.9 % (ref 11.5–15.5)
WBC: 11.8 10*3/uL — ABNORMAL HIGH (ref 4.0–10.5)
nRBC: 0.2 % (ref 0.0–0.2)

## 2020-12-08 LAB — MAGNESIUM: Magnesium: 2.1 mg/dL (ref 1.7–2.4)

## 2020-12-08 LAB — PREPARE RBC (CROSSMATCH)

## 2020-12-08 MED ORDER — POTASSIUM CHLORIDE 20 MEQ PO PACK
40.0000 meq | PACK | Freq: Three times a day (TID) | ORAL | Status: AC
  Administered 2020-12-08 (×2): 40 meq via ORAL
  Filled 2020-12-08 (×2): qty 2

## 2020-12-08 MED ORDER — SODIUM CHLORIDE 0.9% IV SOLUTION: Freq: Once | INTRAVENOUS | Status: DC

## 2020-12-08 MED ORDER — QUETIAPINE FUMARATE 50 MG PO TABS: 25.0000 mg | ORAL_TABLET | Freq: Two times a day (BID) | ORAL | Status: DC

## 2020-12-08 MED ORDER — SODIUM CHLORIDE 0.9% IV SOLUTION
Freq: Once | INTRAVENOUS | Status: AC
  Administered 2020-12-08: 1 mL via INTRAVENOUS

## 2020-12-08 NOTE — Progress Notes (Incomplete)
Date and time results received: 12/08/20 03  Test: Hemoglobin Critical Value: 6.7  Name of Provider Notified: Conner  Orders Received? Or Actions Taken?: ***
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
## 2020-12-08 NOTE — Progress Notes (Signed)
Communicated the plan of care to the patient using the interpreter Matt #[id number]. Patient verbalized understanding. Will continue to monitor.

## 2020-12-08 NOTE — Progress Notes (Signed)
PT Cancellation Note  Patient Details Name: Arthur James MRN: [id number] DOB: 1971-02-21   Cancelled Treatment:    Reason Eval/Treat Not Completed: Medical issues which prohibited therapy. Hgb 6.7 this am. Will check back after transfusion and as time allows.    Sheral Apley 12/08/2020, 10:32 AM

## 2020-12-08 NOTE — Progress Notes (Addendum)
  Progress Note    12/08/2020 8:11 AM 2 Days Post-Op  Subjective:  awake and alert. Video interpretive device not in pt room   Vitals:   12/08/20 0206 12/08/20 0445  BP: 104/66 140/83  Pulse: 94 97  Resp: 17 20  Temp:  98.2 F (36.8 C)  SpO2: 98% 97%    Physical Exam: General appearance: Awake, alert in no apparent distress Cardiac: Heart rate and rhythm are regular Respirations: Nonlabored Incisions: left groin and lower leg incisions are all well approximated without bleeding or hematoma. Compartments are soft Extremities: Both feet are warm with intact sensation and motor function.  1+ left At pulse and 2+ right DP pulse    CBC    Component Value Date/Time   WBC 11.8 (H) 12/08/2020 0208   RBC 2.16 (L) 12/08/2020 0208   HGB 6.7 (LL) 12/08/2020 0208   HCT 19.9 (L) 12/08/2020 0208   PLT 300 12/08/2020 0208   MCV 92.1 12/08/2020 0208   MCH 31.0 12/08/2020 0208   MCHC 33.7 12/08/2020 0208   RDW 12.9 12/08/2020 0208    BMET    Component Value Date/Time   NA 141 12/08/2020 0208   K 3.3 (L) 12/08/2020 0208   CL 109 12/08/2020 0208   CO2 24 12/08/2020 0208   GLUCOSE 104 (H) 12/08/2020 0208   BUN 13 12/08/2020 0208   CREATININE 0.79 12/08/2020 0208   CALCIUM 7.9 (L) 12/08/2020 0208   GFRNONAA >60 12/08/2020 0208     Intake/Output Summary (Last 24 hours) at 12/08/2020 0811 Last data filed at 12/08/2020 0630 Gross per 24 hour  Intake 1160.45 ml  Output 950 ml  Net 210.45 ml    HOSPITAL MEDICATIONS Scheduled Meds: . sodium chloride   Intravenous Once  . acetaminophen  650 mg Oral Q6H  . aspirin  81 mg Per Tube Q0600  . Chlorhexidine Gluconate Cloth  6 each Topical Daily  . docusate  100 mg Per Tube BID  . enoxaparin (LOVENOX) injection  30 mg Subcutaneous Q12H  . feeding supplement  237 mL Oral TID WC  . ketorolac  30 mg Intravenous Q6H  . mouth rinse  15 mL Mouth Rinse BID  . methocarbamol  1,000 mg Oral Q8H  . pantoprazole sodium  40 mg Per Tube  Daily  . polyethylene glycol  17 g Per Tube Daily  . potassium chloride  40 mEq Oral TID  . QUEtiapine  25 mg Oral BID   Continuous Infusions: . dextrose 5 % and 0.45 % NaCl with KCl 20 mEq/L 75 mL/hr at 12/08/20 0630  . magnesium sulfate bolus IVPB     PRN Meds:.haloperidol lactate, HYDROmorphone (DILAUDID) injection, LORazepam, magnesium sulfate bolus IVPB, ondansetron **OR** ondansetron (ZOFRAN) IV, oxyCODONE, phenol  Assessment and Plan: LLE well perfused. On aspirin 81 mg. OT/PT.    -DVT prophylaxis:  Lovenox   Wendi Maya, PA-C [facility] [phone] 12/08/2020  8:11 AM   2+ PT pulse left foot Groin incisions healing Moving left leg more  Acute blood loss anemia will transfuse Continue to mobilize [facility] for d/c from Vascular standpoint when mobile hemoglobin stable and pain controlled on oral meds  Fabienne Bruns, MD [facility] [place] Office: [phone]

## 2020-12-09 LAB — BASIC METABOLIC PANEL
Anion gap: 8 (ref 5–15)
BUN: 9 mg/dL (ref 6–20)
CO2: 21 mmol/L — ABNORMAL LOW (ref 22–32)
Calcium: 8.8 mg/dL — ABNORMAL LOW (ref 8.9–10.3)
Chloride: 110 mmol/L (ref 98–111)
Creatinine, Ser: 0.68 mg/dL (ref 0.61–1.24)
GFR, Estimated: >60 mL/min (ref >60)
Glucose, Bld: 89 mg/dL (ref 70–99)
Potassium: 3.5 mmol/L (ref 3.5–5.1)
Sodium: 139 mmol/L (ref 135–145)

## 2020-12-09 LAB — CBC
HCT: 32.6 % — ABNORMAL LOW (ref 39.0–52.0)
Hemoglobin: 10.8 g/dL — ABNORMAL LOW (ref 13.0–17.0)
MCH: 30 pg (ref 26.0–34.0)
MCHC: 33.1 g/dL (ref 30.0–36.0)
MCV: 90.6 fL (ref 80.0–100.0)
Platelets: 409 10*3/uL — ABNORMAL HIGH (ref 150–400)
RBC: 3.6 MIL/uL — ABNORMAL LOW (ref 4.22–5.81)
RDW: 13.2 % (ref 11.5–15.5)
WBC: 8.5 10*3/uL (ref 4.0–10.5)
nRBC: 0.8 % — ABNORMAL HIGH (ref 0.0–0.2)

## 2020-12-09 MED ORDER — PANTOPRAZOLE SODIUM 40 MG PO TBEC
40.0000 mg | DELAYED_RELEASE_TABLET | Freq: Every day | ORAL | Status: DC
  Administered 2020-12-09 – 2020-12-11 (×3): 40 mg via ORAL
  Filled 2020-12-09 (×3): qty 1

## 2020-12-09 NOTE — Progress Notes (Signed)
@  nd blood completed without any reaction.

## 2020-12-09 NOTE — Progress Notes (Addendum)
  Progress Note    12/09/2020 7:33 AM 3 Days Post-Op  Subjective:  Video interpreter used. no complaints. States he feels good   Vitals:   12/09/20 0154 12/09/20 0504  BP: 126/81 131/83  Pulse: 83 80  Resp:  18  Temp: 98.4 F (36.9 C) 98.2 F (36.8 C)  SpO2: 99% 100%   Physical Exam: Cardiac:  regular Lungs: non labored  Incisions:  Left groin and left leg fasciotomy sites clean, dry and intact Extremities: left leg compartments soft. Motor and sensation intact. Left foot with palpable 2+ PT pulse. Some edema of left foot. Right foot palpable DP pulse Neurologic: alert and oriented  CBC    Component Value Date/Time   WBC 11.8 (H) 12/08/2020 0208   RBC 2.16 (L) 12/08/2020 0208   HGB 6.7 (LL) 12/08/2020 0208   HCT 19.9 (L) 12/08/2020 0208   PLT 300 12/08/2020 0208   MCV 92.1 12/08/2020 0208   MCH 31.0 12/08/2020 0208   MCHC 33.7 12/08/2020 0208   RDW 12.9 12/08/2020 0208    BMET    Component Value Date/Time   NA 141 12/08/2020 0208   K 3.3 (L) 12/08/2020 0208   CL 109 12/08/2020 0208   CO2 24 12/08/2020 0208   GLUCOSE 104 (H) 12/08/2020 0208   BUN 13 12/08/2020 0208   CREATININE 0.79 12/08/2020 0208   CALCIUM 7.9 (L) 12/08/2020 0208   GFRNONAA >60 12/08/2020 0208    INR    Component Value Date/Time   INR 1.0 12/06/2020 0348     Intake/Output Summary (Last 24 hours) at 12/09/2020 0733 Last data filed at 12/09/2020 0530 Gross per 24 hour  Intake 2287.71 ml  Output 5355 ml  Net -3067.29 ml     Assessment/Plan:  50 y.o. male is s/p Exploration left groin, repair of left superficial femoral artery with vein interposition graft using contralateral reversed greater saphenous vein, thrombectomy left superficial femoral artery, 4 compartment fasciotomy left leg 5 Days Post-Op.  Washout and closure of left leg fasciotomy incisions 3 days post op. He is doing well post operatively. Left leg incisions are intact and without drainage or hematoma. Compartments  soft. BLE well perfused and warm with palpable right DP and PT pulses, L palpable PT pulse. Motor and sensation is intact. Continue Aspirin. Mobilize as tolerated. Acute blood loss anemia. Received 2 units PRBC. Pending follow up labs. Okay from vascular standpoint for discharge when pain controlled and able to mobilize more. Pending CIR. Follow up will be arranged in our office in a couple weeks for staple removal  DVT prophylaxis:  Lovenox   Dory Horn [facility] [phone] 12/09/2020 7:33 AM   Agree with above Needs post transfusion CBC today Ow ok for d/c from our standpoint  Fabienne Bruns, MD [facility] [employer] Office: [phone]

## 2020-12-09 NOTE — Progress Notes (Signed)
   12/07/20 1558  Assess: MEWS Score  Pulse Rate 97  Resp 18  Assess: MEWS Score  MEWS Temp 0  MEWS Systolic 0  MEWS Pulse 0  MEWS RR 0  MEWS LOC 0  MEWS Score 0  MEWS Score Color Green  Assess: if the MEWS score is Yellow or Red  Were vital signs taken at a resting state? Yes  Focused Assessment No change from prior assessment  Early Detection of Sepsis Score *See Row Information* Low  MEWS guidelines implemented *See Row Information* No, vital signs rechecked  Treat  MEWS Interventions Other (Comment) (Instructed Pt to slow down his preathing with Long deep breaths.)  Pain Scale 0-10  Pain Score 4  Pain Location Leg  Pain Orientation Left  Notify: Provider  Provider Name/Title [name] PA  Date Provider Notified 12/07/20  Time Provider Notified 1600  Notification Type Page  Notification Reason Change in status  Provider response No new orders  Date of Provider Response 12/07/20  Time of Provider Response 1600  Document  Patient Outcome Stabilized after interventions  Progress note created (see row info) Yes  Assess: SIRS CRITERIA  SIRS Temperature  0  SIRS Pulse 1  SIRS Respirations  0  SIRS WBC 0  SIRS Score Sum  1
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
## 2020-12-10 DIAGNOSIS — F439 Reaction to severe stress, unspecified: Secondary | ICD-10-CM | POA: Diagnosis present

## 2020-12-10 LAB — TYPE AND SCREEN
ABO/RH(D): B POS
Antibody Screen: NEGATIVE
Unit division: 0
Unit division: 0

## 2020-12-10 LAB — BPAM RBC
Blood Product Expiration Date: 202205182359
Blood Product Expiration Date: 202205282359
ISSUE DATE / TIME: 202205111549
ISSUE DATE / TIME: 202205120131
Unit Type and Rh: 1700
Unit Type and Rh: 7300

## 2020-12-10 NOTE — Plan of Care (Signed)
  Problem: Education: Goal: Knowledge of General Education information will improve Description: Including pain rating scale, medication(s)/side effects and non-pharmacologic comfort measures Outcome: Progressing   Problem: Health Behavior/Discharge Planning: Goal: Ability to manage health-related needs will improve Outcome: Progressing   Problem: Clinical Measurements: Goal: Ability to maintain clinical measurements within normal limits will improve Outcome: Progressing Goal: Will remain free from infection Outcome: Progressing Goal: Diagnostic test results will improve Outcome: Progressing Goal: Respiratory complications will improve Outcome: Progressing Goal: Cardiovascular complication will be avoided Outcome: Progressing   Problem: Clinical Measurements: Goal: Will remain free from infection Outcome: Progressing   Problem: Clinical Measurements: Goal: Diagnostic test results will improve Outcome: Progressing   Problem: Clinical Measurements: Goal: Diagnostic test results will improve Outcome: Progressing   Problem: Clinical Measurements: Goal: Respiratory complications will improve Outcome: Progressing
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
## 2020-12-10 NOTE — Progress Notes (Signed)
Refused am labs.
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
## 2020-12-10 NOTE — Progress Notes (Signed)
[facility]  Subjective  -no complaints.   Objective 125/84 73 97.8 F (36.6 C) (Oral) 18 97%  Intake/Output Summary (Last 24 hours) at 12/10/2020 1247 Last data filed at 12/10/2020 0900 Gross per 24 hour  Intake 540 ml  Output 752 ml  Net -212 ml    Left PT palpable Left leg fasciotomy is closed with nylon's and calf soft  Laboratory Lab Results: Recent Labs    12/08/20 0208 12/09/20 1107  WBC 11.8* 8.5  HGB 6.7* 10.8*  HCT 19.9* 32.6*  PLT 300 409*   BMET Recent Labs    12/08/20 0208 12/09/20 1107  NA 141 139  K 3.3* 3.5  CL 109 110  CO2 24 21*  GLUCOSE 104* 89  BUN 13 9  CREATININE 0.79 0.68  CALCIUM 7.9* 8.8*    COAG Lab Results  Component Value Date   INR 1.0 12/06/2020   INR 1.1 12/04/2020   INR 1.0 12/03/2020   No results found for: PTT  Assessment/Planning:  50 year old male status post left SFA repair with vein interposition by Dr. Darrick Penna last weekend.  Has a palpable pulse this morning.  I removed the dressings from the fasciotomies and these are clean dry and intact.  Calf is soft.  Cephus Shelling 12/10/2020 12:47 PM --

## 2020-12-11 MED ORDER — QUETIAPINE FUMARATE 25 MG PO TABS
25.0000 mg | ORAL_TABLET | Freq: Two times a day (BID) | ORAL | 0 refills | Status: DC
Start: 2020-12-11 — End: 2022-03-02

## 2020-12-11 MED ORDER — ACETAMINOPHEN 325 MG PO TABS: 650.0000 mg | ORAL_TABLET | Freq: Four times a day (QID) | ORAL | Status: DC | PRN

## 2020-12-11 MED ORDER — ASPIRIN 81 MG PO CHEW: 81.0000 mg | CHEWABLE_TABLET | Freq: Every day | ORAL | 0 refills | Status: DC

## 2020-12-11 MED ORDER — OXYCODONE HCL 5 MG PO TABS: 5.0000 mg | ORAL_TABLET | Freq: Four times a day (QID) | ORAL | 0 refills | Status: DC | PRN

## 2020-12-11 NOTE — Plan of Care (Signed)
  Problem: Education: Goal: Knowledge of General Education information will improve Description: Including pain rating scale, medication(s)/side effects and non-pharmacologic comfort measures Outcome: Progressing   Problem: Health Behavior/Discharge Planning: Goal: Ability to manage health-related needs will improve Outcome: Progressing   Problem: Clinical Measurements: Goal: Ability to maintain clinical measurements within normal limits will improve Outcome: Progressing Goal: Respiratory complications will improve Outcome: Progressing Goal: Cardiovascular complication will be avoided Outcome: Progressing   Problem: Nutrition: Goal: Adequate nutrition will be maintained Outcome: Progressing   Problem: Pain Managment: Goal: General experience of comfort will improve Outcome: Progressing   Problem: Safety: Goal: Ability to remain free from injury will improve Outcome: Progressing   Problem: Skin Integrity: Goal: Risk for impaired skin integrity will decrease Outcome: Progressing   
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
## 2020-12-11 NOTE — Progress Notes (Signed)
AVS given and reviewed with pt via stratus video interpreter. Pt's wife and son at bedside for discharge teaching. Written and signed prescriptions for outpatient therapies given. Medications discussed. Dr. Cliffton Asters to call in oxycodone prescription to pharmacy on AVS. Pt and family aware. All questions answered to satisfaction. Pt verbalized understanding of information given, including where to go for outpatient therapies. Pt escorted off the unit with all belongings via wheelchair by staff member.

## 2020-12-13 ENCOUNTER — Encounter: Payer: Self-pay | Admitting: Orthopedic Surgery

## 2020-12-13 ENCOUNTER — Emergency Department (HOSPITAL_COMMUNITY)
Admission: EM | Admit: 2020-12-13 | Discharge: 2020-12-13 | Disposition: A | Discharge status: Home / Self Care | Payer: Medicaid Other | Attending: Emergency Medicine | Admitting: Emergency Medicine

## 2020-12-13 DIAGNOSIS — Z4889 Encounter for other specified surgical aftercare: Secondary | ICD-10-CM | POA: Diagnosis not present

## 2020-12-13 DIAGNOSIS — Z5321 Procedure and treatment not carried out due to patient leaving prior to being seen by health care provider: Secondary | ICD-10-CM | POA: Insufficient documentation

## 2020-12-13 NOTE — ED Notes (Signed)
Pt asked to use the phone, he called his son to come and get him. Pt just left with son, moving him OTF.

## 2020-12-13 NOTE — ED Notes (Signed)
Patient seen leaving ED. RN notified.

## 2020-12-30 ENCOUNTER — Other Ambulatory Visit: Payer: Self-pay

## 2020-12-30 ENCOUNTER — Ambulatory Visit (INDEPENDENT_AMBULATORY_CARE_PROVIDER_SITE_OTHER): Payer: Medicaid Other | Admitting: Physician Assistant

## 2020-12-30 VITALS — BP 124/75 | HR 101 | Temp 98.7°F | Resp 20 | Ht 66.0 in | Wt 154.3 lb

## 2020-12-30 DIAGNOSIS — M79605 Pain in left leg: Secondary | ICD-10-CM

## 2020-12-30 DIAGNOSIS — M7989 Other specified soft tissue disorders: Secondary | ICD-10-CM

## 2020-12-30 DIAGNOSIS — W3400XA Accidental discharge from unspecified firearms or gun, initial encounter: Secondary | ICD-10-CM

## 2020-12-30 MED ORDER — CEPHALEXIN 500 MG PO CAPS: 500.0000 mg | ORAL_CAPSULE | Freq: Two times a day (BID) | ORAL | 0 refills | Status: DC

## 2020-12-30 MED ORDER — OXYCODONE-ACETAMINOPHEN 5-325 MG PO TABS: 1.0000 | ORAL_TABLET | Freq: Four times a day (QID) | ORAL | 0 refills | Status: DC | PRN
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
## 2020-12-31 ENCOUNTER — Other Ambulatory Visit: Payer: Self-pay | Admitting: *Deleted

## 2020-12-31 DIAGNOSIS — M25569 Pain in unspecified knee: Secondary | ICD-10-CM

## 2021-01-12 ENCOUNTER — Other Ambulatory Visit: Payer: Self-pay | Admitting: Vascular Surgery

## 2021-01-12 ENCOUNTER — Ambulatory Visit (INDEPENDENT_AMBULATORY_CARE_PROVIDER_SITE_OTHER): Payer: Medicaid Other | Admitting: Vascular Surgery

## 2021-01-12 ENCOUNTER — Other Ambulatory Visit: Payer: Self-pay

## 2021-01-12 ENCOUNTER — Ambulatory Visit (HOSPITAL_COMMUNITY)
Admission: RE | Admit: 2021-01-12 | Discharge: 2021-01-12 | Disposition: A | Discharge status: Home / Self Care | Payer: Medicaid Other | Source: Ambulatory Visit | Attending: Vascular Surgery | Admitting: Vascular Surgery

## 2021-01-12 ENCOUNTER — Ambulatory Visit (INDEPENDENT_AMBULATORY_CARE_PROVIDER_SITE_OTHER)
Admission: RE | Admit: 2021-01-12 | Discharge: 2021-01-12 | Disposition: A | Discharge status: Home / Self Care | Payer: Medicaid Other | Source: Ambulatory Visit | Attending: Vascular Surgery | Admitting: Vascular Surgery

## 2021-01-12 DIAGNOSIS — Z48812 Encounter for surgical aftercare following surgery on the circulatory system: Secondary | ICD-10-CM

## 2021-01-12 DIAGNOSIS — M25569 Pain in unspecified knee: Secondary | ICD-10-CM

## 2021-01-12 DIAGNOSIS — W3400XA Accidental discharge from unspecified firearms or gun, initial encounter: Secondary | ICD-10-CM

## 2021-01-12 DIAGNOSIS — M79669 Pain in unspecified lower leg: Secondary | ICD-10-CM

## 2021-01-12 MED ORDER — ELIQUIS 5 MG PO TABS: ORAL_TABLET | ORAL | 1 refills | Status: DC
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
## 2021-01-13 ENCOUNTER — Ambulatory Visit: Payer: Medicaid Other

## 2021-02-10 ENCOUNTER — Other Ambulatory Visit: Payer: Self-pay

## 2021-02-10 ENCOUNTER — Ambulatory Visit (INDEPENDENT_AMBULATORY_CARE_PROVIDER_SITE_OTHER): Payer: Medicaid Other | Admitting: Vascular Surgery

## 2021-02-10 ENCOUNTER — Encounter: Payer: Self-pay | Admitting: Vascular Surgery

## 2021-02-10 VITALS — BP 129/84 | HR 88 | Temp 98.3°F | Resp 20 | Ht 66.0 in | Wt 155.4 lb

## 2021-02-10 DIAGNOSIS — M7989 Other specified soft tissue disorders: Secondary | ICD-10-CM

## 2021-02-10 DIAGNOSIS — W3400XA Accidental discharge from unspecified firearms or gun, initial encounter: Secondary | ICD-10-CM

## 2021-02-10 DIAGNOSIS — Z9889 Other specified postprocedural states: Secondary | ICD-10-CM

## 2021-02-10 MED ORDER — APIXABAN 5 MG PO TABS: 5.0000 mg | ORAL_TABLET | Freq: Two times a day (BID) | ORAL | 0 refills | Status: DC
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
## 2021-02-14 ENCOUNTER — Other Ambulatory Visit: Payer: Self-pay

## 2021-02-14 DIAGNOSIS — W3400XA Accidental discharge from unspecified firearms or gun, initial encounter: Secondary | ICD-10-CM

## 2021-02-24 DIAGNOSIS — Z0279 Encounter for issue of other medical certificate: Secondary | ICD-10-CM

## 2021-03-03 ENCOUNTER — Other Ambulatory Visit: Payer: Self-pay

## 2021-03-03 ENCOUNTER — Ambulatory Visit: Payer: Medicaid Other | Attending: Vascular Surgery | Admitting: Physical Therapy

## 2021-03-03 ENCOUNTER — Encounter: Payer: Self-pay | Admitting: Physical Therapy

## 2021-03-03 DIAGNOSIS — M25552 Pain in left hip: Secondary | ICD-10-CM | POA: Diagnosis present

## 2021-03-03 DIAGNOSIS — M25662 Stiffness of left knee, not elsewhere classified: Secondary | ICD-10-CM | POA: Diagnosis present

## 2021-03-03 DIAGNOSIS — M25562 Pain in left knee: Secondary | ICD-10-CM | POA: Insufficient documentation

## 2021-03-03 DIAGNOSIS — R2689 Other abnormalities of gait and mobility: Secondary | ICD-10-CM | POA: Diagnosis present

## 2021-03-03 DIAGNOSIS — M6281 Muscle weakness (generalized): Secondary | ICD-10-CM | POA: Insufficient documentation

## 2021-03-03 DIAGNOSIS — G8929 Other chronic pain: Secondary | ICD-10-CM | POA: Insufficient documentation
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
## 2021-03-04 NOTE — Patient Instructions (Signed)
Access Code: [email or web address] URL: [email or web address] Date: 03/04/2021 Prepared by: Alphonzo Severance  Exercises Seated Knee Extension Stretch with Chair - 1 x daily - 7 x weekly - 3 sets - 10 reps Supine Quad Set - 3 x daily - 7 x weekly - 2 sets - 10 reps - 10 hold Ankle Alphabet in Elevation - 1 x daily - 7 x weekly - 3 sets - 10 reps

## 2021-03-16 DIAGNOSIS — Z0271 Encounter for disability determination: Secondary | ICD-10-CM

## 2021-04-13 ENCOUNTER — Ambulatory Visit: Payer: Medicaid Other | Admitting: Physical Therapy

## 2021-04-14 ENCOUNTER — Other Ambulatory Visit: Payer: Self-pay

## 2021-04-14 ENCOUNTER — Ambulatory Visit: Payer: Medicaid Other | Attending: Vascular Surgery

## 2021-04-14 ENCOUNTER — Ambulatory Visit: Payer: Medicaid Other

## 2021-04-14 DIAGNOSIS — M25552 Pain in left hip: Secondary | ICD-10-CM | POA: Insufficient documentation

## 2021-04-14 DIAGNOSIS — R2681 Unsteadiness on feet: Secondary | ICD-10-CM | POA: Diagnosis present

## 2021-04-14 DIAGNOSIS — R262 Difficulty in walking, not elsewhere classified: Secondary | ICD-10-CM | POA: Diagnosis present

## 2021-04-14 DIAGNOSIS — R2689 Other abnormalities of gait and mobility: Secondary | ICD-10-CM | POA: Diagnosis not present

## 2021-04-14 DIAGNOSIS — M6281 Muscle weakness (generalized): Secondary | ICD-10-CM | POA: Insufficient documentation
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
## 2021-04-14 NOTE — Patient Instructions (Signed)
Access Code: [email or web address] URL: [email or web address] Date: 04/14/2021 Prepared by: Jethro Bastos  Exercises Supine Quadricep Sets - 1 x daily - 7 x weekly - 1 sets - 10 reps - 5 seconds hold Supine Heel Slide - 1 x daily - 7 x weekly - 1 sets - 10 reps

## 2021-04-15 NOTE — Addendum Note (Signed)
Addended by: Jethro Bastos B on: 04/15/2021 12:30 PM   Modules accepted: Orders

## 2021-04-19 ENCOUNTER — Ambulatory Visit: Payer: Medicaid Other

## 2021-04-19 ENCOUNTER — Other Ambulatory Visit: Payer: Self-pay

## 2021-04-19 VITALS — BP 128/92 | HR 90

## 2021-04-19 DIAGNOSIS — R2689 Other abnormalities of gait and mobility: Secondary | ICD-10-CM | POA: Diagnosis not present

## 2021-04-19 DIAGNOSIS — M6281 Muscle weakness (generalized): Secondary | ICD-10-CM

## 2021-04-19 DIAGNOSIS — M25552 Pain in left hip: Secondary | ICD-10-CM

## 2021-04-19 DIAGNOSIS — R262 Difficulty in walking, not elsewhere classified: Secondary | ICD-10-CM
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
## 2021-04-21 ENCOUNTER — Encounter: Payer: Self-pay | Admitting: Physical Therapy

## 2021-04-21 ENCOUNTER — Ambulatory Visit (HOSPITAL_COMMUNITY)
Admission: RE | Admit: 2021-04-21 | Discharge: 2021-04-21 | Disposition: A | Discharge status: Home / Self Care | Payer: Medicaid Other | Source: Ambulatory Visit | Attending: Vascular Surgery | Admitting: Vascular Surgery

## 2021-04-21 ENCOUNTER — Other Ambulatory Visit: Payer: Self-pay

## 2021-04-21 ENCOUNTER — Ambulatory Visit: Payer: Medicaid Other | Admitting: Physical Therapy

## 2021-04-21 ENCOUNTER — Ambulatory Visit (INDEPENDENT_AMBULATORY_CARE_PROVIDER_SITE_OTHER)
Admission: RE | Admit: 2021-04-21 | Discharge: 2021-04-21 | Disposition: A | Discharge status: Home / Self Care | Payer: Medicaid Other | Source: Ambulatory Visit | Attending: Vascular Surgery | Admitting: Vascular Surgery

## 2021-04-21 ENCOUNTER — Ambulatory Visit (INDEPENDENT_AMBULATORY_CARE_PROVIDER_SITE_OTHER): Payer: Medicaid Other | Admitting: Physician Assistant

## 2021-04-21 VITALS — BP 134/90

## 2021-04-21 VITALS — BP 110/70 | Resp 20 | Ht 66.0 in | Wt 137.6 lb

## 2021-04-21 DIAGNOSIS — R600 Localized edema: Secondary | ICD-10-CM

## 2021-04-21 DIAGNOSIS — M6281 Muscle weakness (generalized): Secondary | ICD-10-CM

## 2021-04-21 DIAGNOSIS — R2689 Other abnormalities of gait and mobility: Secondary | ICD-10-CM

## 2021-04-21 DIAGNOSIS — W3400XA Accidental discharge from unspecified firearms or gun, initial encounter: Secondary | ICD-10-CM

## 2021-04-21 DIAGNOSIS — M25552 Pain in left hip: Secondary | ICD-10-CM

## 2021-04-21 DIAGNOSIS — M542 Cervicalgia: Secondary | ICD-10-CM | POA: Diagnosis not present

## 2021-04-21 DIAGNOSIS — R29898 Other symptoms and signs involving the musculoskeletal system: Secondary | ICD-10-CM | POA: Insufficient documentation

## 2021-04-21 DIAGNOSIS — R251 Tremor, unspecified: Secondary | ICD-10-CM | POA: Diagnosis not present

## 2021-04-21 DIAGNOSIS — R262 Difficulty in walking, not elsewhere classified: Secondary | ICD-10-CM
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
## 2021-04-25 ENCOUNTER — Other Ambulatory Visit: Payer: Self-pay

## 2021-04-25 ENCOUNTER — Ambulatory Visit: Payer: Medicaid Other

## 2021-04-25 VITALS — BP 112/68 | HR 88

## 2021-04-25 DIAGNOSIS — R2689 Other abnormalities of gait and mobility: Secondary | ICD-10-CM | POA: Diagnosis not present

## 2021-04-25 DIAGNOSIS — R262 Difficulty in walking, not elsewhere classified: Secondary | ICD-10-CM

## 2021-04-25 DIAGNOSIS — M6281 Muscle weakness (generalized): Secondary | ICD-10-CM
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
## 2021-04-26 ENCOUNTER — Other Ambulatory Visit: Payer: Self-pay

## 2021-04-26 DIAGNOSIS — W3400XA Accidental discharge from unspecified firearms or gun, initial encounter: Secondary | ICD-10-CM

## 2021-04-28 ENCOUNTER — Ambulatory Visit: Payer: Medicaid Other

## 2021-04-28 ENCOUNTER — Other Ambulatory Visit: Payer: Self-pay

## 2021-04-28 VITALS — BP 128/88 | HR 96

## 2021-04-28 DIAGNOSIS — R262 Difficulty in walking, not elsewhere classified: Secondary | ICD-10-CM

## 2021-04-28 DIAGNOSIS — M6281 Muscle weakness (generalized): Secondary | ICD-10-CM

## 2021-04-28 DIAGNOSIS — R2689 Other abnormalities of gait and mobility: Secondary | ICD-10-CM
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
## 2021-05-02 ENCOUNTER — Ambulatory Visit: Payer: Medicaid Other | Attending: Vascular Surgery

## 2021-05-02 ENCOUNTER — Other Ambulatory Visit: Payer: Self-pay

## 2021-05-02 VITALS — BP 128/82 | HR 96

## 2021-05-02 DIAGNOSIS — M6281 Muscle weakness (generalized): Secondary | ICD-10-CM | POA: Insufficient documentation

## 2021-05-02 DIAGNOSIS — R262 Difficulty in walking, not elsewhere classified: Secondary | ICD-10-CM | POA: Insufficient documentation

## 2021-05-02 DIAGNOSIS — R2689 Other abnormalities of gait and mobility: Secondary | ICD-10-CM | POA: Insufficient documentation
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
## 2021-05-05 ENCOUNTER — Ambulatory Visit: Payer: Medicaid Other

## 2021-05-09 ENCOUNTER — Ambulatory Visit: Payer: Medicaid Other

## 2021-05-12 ENCOUNTER — Ambulatory Visit: Payer: Medicaid Other

## 2021-05-12 ENCOUNTER — Other Ambulatory Visit: Payer: Self-pay

## 2021-05-12 VITALS — BP 122/76 | HR 80

## 2021-05-12 DIAGNOSIS — R2689 Other abnormalities of gait and mobility: Secondary | ICD-10-CM | POA: Diagnosis not present

## 2021-05-12 DIAGNOSIS — R262 Difficulty in walking, not elsewhere classified: Secondary | ICD-10-CM

## 2021-05-12 DIAGNOSIS — M6281 Muscle weakness (generalized): Secondary | ICD-10-CM
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
## 2021-05-16 ENCOUNTER — Other Ambulatory Visit: Payer: Self-pay

## 2021-05-16 ENCOUNTER — Ambulatory Visit: Payer: Medicaid Other

## 2021-05-16 VITALS — BP 122/88 | HR 85

## 2021-05-16 DIAGNOSIS — M6281 Muscle weakness (generalized): Secondary | ICD-10-CM

## 2021-05-16 DIAGNOSIS — R2689 Other abnormalities of gait and mobility: Secondary | ICD-10-CM

## 2021-05-16 DIAGNOSIS — R262 Difficulty in walking, not elsewhere classified: Secondary | ICD-10-CM
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
## 2021-05-19 ENCOUNTER — Other Ambulatory Visit: Payer: Self-pay

## 2021-05-19 ENCOUNTER — Ambulatory Visit: Payer: Medicaid Other

## 2021-05-19 VITALS — BP 122/84 | HR 83

## 2021-05-19 DIAGNOSIS — R262 Difficulty in walking, not elsewhere classified: Secondary | ICD-10-CM

## 2021-05-19 DIAGNOSIS — M6281 Muscle weakness (generalized): Secondary | ICD-10-CM

## 2021-05-19 DIAGNOSIS — R2689 Other abnormalities of gait and mobility: Secondary | ICD-10-CM | POA: Diagnosis not present
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
## 2021-05-23 ENCOUNTER — Ambulatory Visit: Payer: Medicaid Other

## 2021-05-26 ENCOUNTER — Ambulatory Visit: Payer: Medicaid Other

## 2021-05-30 ENCOUNTER — Ambulatory Visit: Payer: Medicaid Other

## 2021-06-02 ENCOUNTER — Ambulatory Visit: Payer: Medicaid Other

## 2021-06-06 ENCOUNTER — Ambulatory Visit: Payer: Medicaid Other

## 2021-06-09 ENCOUNTER — Ambulatory Visit: Payer: Medicaid Other

## 2021-06-30 ENCOUNTER — Telehealth: Payer: Self-pay

## 2021-06-30 ENCOUNTER — Other Ambulatory Visit: Payer: Self-pay

## 2021-06-30 DIAGNOSIS — R600 Localized edema: Secondary | ICD-10-CM

## 2021-06-30 NOTE — Telephone Encounter (Signed)
Patient is s/p femoral artery exploration and repair of L SFA in 11/2020. He has a history of DVT and recently came off Eliquis. Family member calls today to report that patient had acute swelling that started about 2 weeks ago from the knee to the foot and it is painful. Placed patient on the schedule for DVT study and evaluation.

## 2021-07-04 ENCOUNTER — Ambulatory Visit (HOSPITAL_COMMUNITY)
Admission: RE | Admit: 2021-07-04 | Discharge: 2021-07-04 | Disposition: A | Discharge status: Home / Self Care | Payer: Medicaid Other | Source: Ambulatory Visit | Attending: Physician Assistant | Admitting: Physician Assistant

## 2021-07-04 ENCOUNTER — Other Ambulatory Visit: Payer: Self-pay

## 2021-07-04 ENCOUNTER — Ambulatory Visit (INDEPENDENT_AMBULATORY_CARE_PROVIDER_SITE_OTHER): Payer: Medicaid Other | Admitting: Physician Assistant

## 2021-07-04 VITALS — BP 114/75 | HR 97 | Temp 98.4°F | Resp 20 | Ht 66.0 in | Wt 162.0 lb

## 2021-07-04 DIAGNOSIS — I82502 Chronic embolism and thrombosis of unspecified deep veins of left lower extremity: Secondary | ICD-10-CM | POA: Diagnosis not present

## 2021-07-04 DIAGNOSIS — W3400XA Accidental discharge from unspecified firearms or gun, initial encounter: Secondary | ICD-10-CM

## 2021-07-04 DIAGNOSIS — M25569 Pain in unspecified knee: Secondary | ICD-10-CM | POA: Diagnosis not present

## 2021-07-04 DIAGNOSIS — R600 Localized edema: Secondary | ICD-10-CM

## 2021-07-04 DIAGNOSIS — Y249XXA Unspecified firearm discharge, undetermined intent, initial encounter: Secondary | ICD-10-CM

## 2021-07-04 NOTE — Progress Notes (Signed)
Office Note     CC:  follow up Requesting Provider:  Courtney Paris, NP  HPI: Arthur James is a 50 y.o. ([birth year]) male who presents with worsening swelling and pain in left leg. Video translator used to assist in patients visit today as patient speaks Arabic. He explains that over the past month he has had more swelling and pain in left leg. He does elevate his leg regularly but says due to pain he does not tolerate his wife placing the compression stocking on his leg. He also reports inability to move leg and decreased sensation along the shin. He says he goes to the pain clinic but that he is out of his medication before his next appointment and that it is not helping.   The pt is on a statin for cholesterol management.  The pt is on a daily aspirin.   Other AC:  None The pt is not on medication  for hypertension.   The pt is not diabetic.   Tobacco hx:  current  Past Medical History:  Diagnosis Date   Neck pain    Tremor    right hand     Past Surgical History:  Procedure Laterality Date   APPLICATION OF WOUND VAC Left 12/04/2020   Procedure: APPLICATION OF WOUND VAC;  Surgeon: Sherren Kerns, MD;  Location: [facility] OR;  Service: Vascular;  Laterality: Left;   FASCIOTOMY Left 12/04/2020   Procedure: Four Compartment FASCIOTOMY;  Surgeon: Sherren Kerns, MD;  Location: [facility] OR;  Service: Vascular;  Laterality: Left;   FASCIOTOMY CLOSURE Left 12/06/2020   Procedure: FASCIOTOMY LEFT LEG CLOSURE AND WASH OUT;  Surgeon: Cephus Shelling, MD;  Location: [facility] OR;  Service: Vascular;  Laterality: Left;   FEMORAL ARTERY EXPLORATION Left 12/04/2020   Procedure: FEMORAL ARTERY EXPLORATION, Repair of Left Superficial Femoral Artery using right Saphenous ven.;  Surgeon: Sherren Kerns, MD;  Location: [facility] OR;  Service: Vascular;  Laterality: Left;   SHOULDER SURGERY Right     Social History   Socioeconomic History   Marital status: Married    Spouse name: Not on file   Number of children:  Not on file   Years of education: Not on file   Highest education level: Not on file  Occupational History   Not on file  Tobacco Use   Smoking status: Every Day    Packs/day: 2.00    Types: Cigarettes   Smokeless tobacco: Never  Vaping Use   Vaping Use: Never used  Substance and Sexual Activity   Alcohol use: Never   Drug use: Yes    Types: Oxycodone   Sexual activity: Not on file  Other Topics Concern   Not on file  Social History Narrative   ** Merged History Encounter **       Right handed  Caffeine " states a lot throughout the day"   Social Determinants of Health   Financial Resource Strain: Not on file  Food Insecurity: Not on file  Transportation Needs: Not on file  Physical Activity: Not on file  Stress: Not on file  Social Connections: Not on file  Intimate Partner Violence: Not on file   No family history on file.  Current Outpatient Medications  Medication Sig Dispense Refill   acetaminophen (TYLENOL) 325 MG tablet Take 2 tablets (650 mg total) by mouth every 6 (six) hours as needed.     apixaban (ELIQUIS) 5 MG TABS tablet Take 1 tablet (5 mg total) by mouth 2 (  two) times daily. 120 tablet 0   aspirin 81 MG chewable tablet Chew 1 tablet (81 mg total) by mouth daily at 6 (six) AM. 30 tablet 0   baclofen (LIORESAL) 10 MG tablet Take 10 mg by mouth 3 (three) times daily.     baclofen (LIORESAL) 20 MG tablet Take by mouth.     cephALEXin (KEFLEX) 500 MG capsule Take 1 capsule (500 mg total) by mouth 2 (two) times daily. 20 capsule 0   cloNIDine (CATAPRES) 0.1 MG tablet Take 0.1 mg by mouth 2 (two) times daily as needed for withdrawal.     DULoxetine (CYMBALTA) 60 MG capsule Take 1 capsule (60 mg total) by mouth daily. 30 capsule 12   etodolac (LODINE) 400 MG tablet Take 400 mg by mouth daily.     ondansetron (ZOFRAN-ODT) 4 MG disintegrating tablet Take 4 mg by mouth 3 (three) times daily as needed for nausea/vomiting.     oxyCODONE (OXY IR/ROXICODONE) 5 MG  immediate release tablet Take 1 tablet (5 mg total) by mouth every 6 (six) hours as needed for moderate pain or severe pain (not releived by tylenol, etodolac). 15 tablet 0   oxyCODONE-acetaminophen (PERCOCET) 10-325 MG tablet TK 1 T PO QID PRN     oxyCODONE-acetaminophen (PERCOCET) 5-325 MG tablet Take 1 tablet by mouth every 6 (six) hours as needed for severe pain. 4 tablet 0   pravastatin (PRAVACHOL) 10 MG tablet Take 10 mg by mouth at bedtime.     pregabalin (LYRICA) 150 MG capsule Take 150 mg by mouth 3 (three) times daily.     pregabalin (LYRICA) 200 MG capsule TK 1 C PO BID     QUEtiapine (SEROQUEL) 25 MG tablet Take 1 tablet (25 mg total) by mouth 2 (two) times daily. 60 tablet 0   tiZANidine (ZANAFLEX) 4 MG capsule Take 4 mg by mouth 3 (three) times daily.     Vitamin D, Ergocalciferol, (DRISDOL) 1.25 MG (50000 UNIT) CAPS capsule Take 1 capsule by mouth once a week.     XTAMPZA ER 36 MG C12A Take 1 capsule by mouth 2 (two) times daily.     XTAMPZA ER 36 MG C12A Take 36 mg by mouth 2 (two) times daily.     apixaban (ELIQUIS) 5 MG TABS tablet Take 2 tablets (10mg ) twice daily for 7 days, then 1 tablet (5mg ) twice daily (Patient not taking: Reported on 07/04/2021) 90 tablet 1   No current facility-administered medications for this visit.    No Known Allergies   REVIEW OF SYSTEMS:   [X]  denotes positive finding, [ ]  denotes negative finding Cardiac  Comments:  Chest pain or chest pressure:    Shortness of breath upon exertion:    Short of breath when lying flat:    Irregular heart rhythm:        Vascular    Pain in calf, thigh, or hip brought on by ambulation:    Pain in feet at night that wakes you up from your sleep:     Blood clot in your veins:    Leg swelling:  X       Pulmonary    Oxygen at home:    Productive cough:     Wheezing:         Neurologic    Sudden weakness in arms or legs:     Sudden numbness in arms or legs:     Sudden onset of difficulty speaking or  slurred speech:    Temporary loss  of vision in one eye:     Problems with dizziness:         Gastrointestinal    Blood in stool:     Vomited blood:         Genitourinary    Burning when urinating:     Blood in urine:        Psychiatric    Major depression:         Hematologic    Bleeding problems:    Problems with blood clotting too easily:        Skin    Rashes or ulcers:        Constitutional    Fever or chills:      PHYSICAL EXAMINATION:  Vitals:   07/04/21 1127  BP: 114/75  Pulse: 97  Resp: 20  Temp: 98.4 F (36.9 C)  TempSrc: Temporal  SpO2: 95%  Weight: 162 lb (73.5 kg)  Height: 5\' 6"  (1.676 m)    General:  WDWN in NAD; vital signs documented above Gait: shuffling, ambulates with cane HENT: WNL, normocephalic Pulmonary: normal non-labored breathing , without wheezing Cardiac: regular HR, without  Murmurs without carotid bruit Abdomen: soft, NT, no masses Vascular Exam/Pulses:2 + radial, R Dp PT palpable. Left Doppler PT/ Dp/ pero signals. Left leg edematous from knee down to dorsum of foot. 2+ pitting. Foot warm. Motor intact. Decreased sensation. Extremities: without ischemic changes, without Gangrene , without cellulitis; without open wounds;  Musculoskeletal: no muscle wasting or atrophy  Neurologic: A&O X 3;  No focal weakness or paresthesias are detected Psychiatric:  The pt has Normal affect.   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  - Patent CFA, profunda femoral artery, mid SFA, popliteal and posterior tibial artery.     ASSESSMENT/PLAN:: 50 y.o. male here for follow up for LLE DVT. He reports worsening pain and swelling in left leg. He has not been able to tolerate compression. I have encouraged him to continue to elevate and also use compression as  much as he can. 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 I explained that because of his  chronic DVT and scarring his swelling is very likely to be persistent and that it has to be managed conservatively. His left leg is otherwise well perfused. His LLE arterial bypass is patent with Brisk doppler signals in left foot.  - He needs to continue to follow up with pain management  - continue statin and Aspirin - he will follow up with 52 in 4 months with LLE bypass graft duplex   US, PA-C [facility] [phone]  Clinic MD:   [phone]

## 2021-07-06 ENCOUNTER — Other Ambulatory Visit: Payer: Self-pay | Admitting: *Deleted

## 2021-07-06 DIAGNOSIS — W3400XA Accidental discharge from unspecified firearms or gun, initial encounter: Secondary | ICD-10-CM

## 2021-10-12 ENCOUNTER — Institutional Professional Consult (permissible substitution): Payer: Medicaid Other | Admitting: Neurology

## 2021-10-12 ENCOUNTER — Encounter: Payer: Self-pay | Admitting: Neurology
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
## 2021-11-17 ENCOUNTER — Ambulatory Visit: Payer: Medicaid Other

## 2021-11-17 ENCOUNTER — Encounter (HOSPITAL_COMMUNITY): Payer: Medicaid Other

## 2021-11-17 ENCOUNTER — Other Ambulatory Visit (HOSPITAL_COMMUNITY): Payer: Medicaid Other

## 2021-12-13 ENCOUNTER — Encounter: Payer: Self-pay | Admitting: Family Medicine

## 2021-12-13 ENCOUNTER — Ambulatory Visit (INDEPENDENT_AMBULATORY_CARE_PROVIDER_SITE_OTHER): Payer: Medicaid Other | Admitting: Family Medicine

## 2021-12-13 VITALS — BP 134/95 | HR 85 | Ht 64.17 in | Wt 153.8 lb

## 2021-12-13 DIAGNOSIS — Z558 Other problems related to education and literacy: Secondary | ICD-10-CM

## 2021-12-13 DIAGNOSIS — G8921 Chronic pain due to trauma: Secondary | ICD-10-CM | POA: Diagnosis not present

## 2021-12-13 DIAGNOSIS — I82409 Acute embolism and thrombosis of unspecified deep veins of unspecified lower extremity: Secondary | ICD-10-CM

## 2021-12-13 DIAGNOSIS — Z556 Problems related to health literacy: Secondary | ICD-10-CM

## 2021-12-13 DIAGNOSIS — Z603 Acculturation difficulty: Secondary | ICD-10-CM | POA: Diagnosis not present

## 2021-12-13 DIAGNOSIS — Z79899 Other long term (current) drug therapy: Secondary | ICD-10-CM | POA: Diagnosis not present

## 2021-12-13 DIAGNOSIS — R413 Other amnesia: Secondary | ICD-10-CM | POA: Insufficient documentation

## 2021-12-13 DIAGNOSIS — I824Y2 Acute embolism and thrombosis of unspecified deep veins of left proximal lower extremity: Secondary | ICD-10-CM

## 2021-12-13 DIAGNOSIS — Z7689 Persons encountering health services in other specified circumstances: Secondary | ICD-10-CM

## 2021-12-13 DIAGNOSIS — G8929 Other chronic pain: Secondary | ICD-10-CM | POA: Insufficient documentation

## 2021-12-13 MED ORDER — NALOXONE HCL 4 MG/0.1ML NA LIQD: NASAL | 2 refills | Status: DC

## 2021-12-13 NOTE — Patient Instructions (Addendum)
It was wonderful to meet you today. Thank you for allowing me to be a part of your care. Below is a short summary of what we discussed at your visit today: ? ?[id number] ?Dr. Manson Passey runs an immigration refugee clinic on certain Thursdays in the clinic.  She cannot be fill out the N 648 at that clinic.  Because of the complexity of these documents, she needs a certain amount of time to do this.  You have been booked in the refugee clinic at the first available time she can do that, which is June 27.  Please see details below. ? ?Medicine reconciliation ?We need to know every single medicine you are on in order to provide the best care for you.  Please look around the house and collect up all of your medications.  Bring them to your next appointment with [place].  You will see Dr. Raymondo Band, our pharmacist, this Thursday at 3:30 PM. ? ?Urinary complaint ?I have booked you an appointment with Dr. Manley Mason Lott this Thursday at 2:30 PM.  Please arrive by 2:15 PM. ? ?PCP follow-up ?I have booked you with myself, your primary care doctor, for the first available appointment that I have in my schedule.  This will be on June 19 at 3:50 PM.  Please see below for all the appointment details. ? ?Please bring all of your medications to every appointment! ? ?If you have any questions or concerns, please do not hesitate to contact us via phone or MyChart message.  ? ?Fayette Pho, MD  ?
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
## 2021-12-14 DIAGNOSIS — Z7689 Persons encountering health services in other specified circumstances: Secondary | ICD-10-CM | POA: Insufficient documentation

## 2021-12-14 DIAGNOSIS — Z556 Problems related to health literacy: Secondary | ICD-10-CM | POA: Insufficient documentation

## 2021-12-14 DIAGNOSIS — Z603 Acculturation difficulty: Secondary | ICD-10-CM | POA: Insufficient documentation

## 2021-12-14 DIAGNOSIS — Z79899 Other long term (current) drug therapy: Secondary | ICD-10-CM | POA: Insufficient documentation

## 2021-12-14 DIAGNOSIS — Z139 Encounter for screening, unspecified: Secondary | ICD-10-CM | POA: Insufficient documentation
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
## 2021-12-14 NOTE — Assessment & Plan Note (Addendum)
Possibly low health literacy, evaluation compounded by language barrier. When patient pulled narcan nasal spray from bag, he told me that he once took it and "it nearly killed him", he felt terrible afterwards. This happened once before. Neither daughter nor patient could explain how they thought it was to be used. They say this was not explained to him, just given to him at the pharmacy. I took time to thoroughly explain the use and the reason he must have felt poorly afterwards. Sent script for 2 narcan nasal sprays to pharmacy.  ?

## 2021-12-14 NOTE — Assessment & Plan Note (Signed)
Speaks Arabic. Requests completion of N-648. Scheduled with Dr. Manson Passey in immigrant clinic June 27.  ?

## 2021-12-14 NOTE — Assessment & Plan Note (Signed)
Daughter concerned about memory difficulties. Likely due to polypharmacy more likely than underlying primary neurological or psychiatric process. Expect to improve as we work with him to reduce sedating meds. Will address further at future visits.  ?

## 2021-12-14 NOTE — Assessment & Plan Note (Addendum)
Reviewed medical and surgical diagnoses, current providers, allergies, and medications. Medications particularly difficult to sort - patient brought many bottles in a bag, empty bottles and duplicates abounded. Med list in chart updated, two meds (duloxetine and quetiapine) missing from bag but daughter unsure if patient was taking these meds. Daughter disclosed that her father would not know if he was taking these based on the name. Daughter disclosed there were more medications at home that they did not bring in. Scheduled for thorough med rec with Dr. Raymondo Band on Thursday. Appointments scheduled today:  ?- 5/18 2:30pm med rec w/ Dr. Raymondo Band ?- 5/18 3:30pm urinary complaint w/ Dr. Salvadore Dom ?- 6/19 first avail PCP appt ?- 6/27 immigrant clinic for N-648 ?

## 2021-12-14 NOTE — Assessment & Plan Note (Addendum)
Current patient at [facility] pain clinic under care of Courtney Paris, NP. Current medications for chronic pain include baclofen, duloxetine, oxy IR, pregabalin, quetiapine, tizanidine, xtampza ER, and a buprenorphine patch. Significant concern for unintentional medication misuse given patient reports taking more than indicated (ie pregabalin QID not TID) and daughter concerned that the pain clinic does not explain the medications thoroughly to him. All med labels in [place] and patient does not speak or read [place]. Is scheduled for med rec with Dr. Raymondo Band Thursday. Instructed to bring ALL meds from home.  ?

## 2021-12-15 ENCOUNTER — Encounter: Payer: Self-pay | Admitting: Family Medicine

## 2021-12-15 ENCOUNTER — Ambulatory Visit (INDEPENDENT_AMBULATORY_CARE_PROVIDER_SITE_OTHER): Payer: Medicaid Other | Admitting: Pharmacist

## 2021-12-15 ENCOUNTER — Ambulatory Visit (INDEPENDENT_AMBULATORY_CARE_PROVIDER_SITE_OTHER): Payer: Medicaid Other | Admitting: Family Medicine

## 2021-12-15 ENCOUNTER — Telehealth: Payer: Self-pay

## 2021-12-15 VITALS — BP 132/88 | HR 87 | Ht 66.0 in | Wt 154.8 lb

## 2021-12-15 DIAGNOSIS — R634 Abnormal weight loss: Secondary | ICD-10-CM | POA: Diagnosis not present

## 2021-12-15 DIAGNOSIS — R3912 Poor urinary stream: Secondary | ICD-10-CM

## 2021-12-15 DIAGNOSIS — R309 Painful micturition, unspecified: Secondary | ICD-10-CM | POA: Diagnosis present

## 2021-12-15 DIAGNOSIS — Z79891 Long term (current) use of opiate analgesic: Secondary | ICD-10-CM

## 2021-12-15 DIAGNOSIS — R310 Gross hematuria: Secondary | ICD-10-CM | POA: Diagnosis not present

## 2021-12-15 DIAGNOSIS — Z79899 Other long term (current) drug therapy: Secondary | ICD-10-CM

## 2021-12-15 LAB — POCT URINALYSIS DIP (MANUAL ENTRY)
Bilirubin, UA: NEGATIVE
Glucose, UA: NEGATIVE mg/dL
Ketones, POC UA: NEGATIVE mg/dL
Leukocytes, UA: NEGATIVE
Nitrite, UA: NEGATIVE
Protein Ur, POC: NEGATIVE mg/dL
Spec Grav, UA: <=1.005 — AB (ref 1.010–1.025)
Urobilinogen, UA: 0.2 E.U./dL
pH, UA: 5.5 (ref 5.0–8.0)

## 2021-12-15 LAB — POCT UA - MICROSCOPIC ONLY: WBC, Ur, HPF, POC: NONE SEEN (ref 0–5)
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
## 2021-12-15 NOTE — Telephone Encounter (Signed)
Patient was seen by Dr. Salvadore Dom and was given forms. Putting forms in PCP's box. Aquilla Solian, CMA

## 2021-12-15 NOTE — Patient Instructions (Signed)
- ??????? ??????? ???????. ???? ???? ????. ?????? ??? ???? ??????? ??????? ???? ????? ?? ?????? ????. ????? ????? ?????? ???? ? ???? ??? ?????? ????? ? ??????? ????. - ????? ????? ??? ??????? ???? ??? ???? ????? ?????? ?????. - ???? ???????? ?? ???? ??????? ??????? ????? ?? ??????? ????? ??????? ????? ??. -   astimrar al'aerad albawliati. 'azhar biwalak dman. sanuhiluk 'iilaa tabib almasalik albawliat aladhi sayatasil bik litahdid maweidi. sa'aqum aydan biziraeat bulk , wa'iidha kunt msaban bieadwaa , fasa'ukhbiruk bidhalika. - sa'ahsul aydan ealaa mukhtabarat bisabab duef majraa albawl wafiqdan alwazni. - yurjaa almutabaeat mae tabib alrieayat al'awaliat alkhasi bik limunaqashat tanzir alqawlun alkhasi bika.  -Longstanding urinary symptoms.  Your urine showed blood.  We will refer you to the urologist they will call you to schedule an appointment.  I am also going to culture your urine and if you have an infection I will let you know. - I will also get labs due to weak urinary stream and weight loss. - Please follow-up with your primary care doctor to discuss your colonoscopy.  Dr. Salvadore Dom

## 2021-12-15 NOTE — Patient Instructions (Addendum)
????? ??? ??? ????????   24 ???? ?????? 3:30 ??? ?????. ???? ???? ??????? ?????? ?? ??? ??? ??????? ?????? ??? ????? ??? ??? ????? ?????? ????? ??????? ??? ???? ????? ????? ??????. alziyarat alqadimat mae alduktur 'uwtri lut yawm al'arbiea' 24 mayw alsaaeat 3:30 baed alzuhri. 'ahdar jamie al'adwiat alkhasat bik 'iilaa hadhih alziyarat waistahab maeak shkhsan mithl 'ahad 'afrad al'usrat yumkinuh musaeadatuk ealaa tadhakur kayfiat tanawul 'adwiatikin.  Next visit with Dr. Salvadore Dom on Wednesday May 24th at 3:30 in the afternoon. Bring all of your medications to this visit and bring someone with you like a family member who can help you remember how you take your medications.

## 2021-12-16 LAB — TSH: TSH: 1.21 u[IU]/mL (ref 0.450–4.500)

## 2021-12-16 LAB — CBC
Hematocrit: 47.5 % (ref 37.5–51.0)
Hemoglobin: 16.5 g/dL (ref 13.0–17.7)
MCH: 30.3 pg (ref 26.6–33.0)
MCHC: 34.7 g/dL (ref 31.5–35.7)
MCV: 87 fL (ref 79–97)
Platelets: 314 10*3/uL (ref 150–450)
RBC: 5.45 x10E6/uL (ref 4.14–5.80)
RDW: 12.5 % (ref 11.6–15.4)
WBC: 6.7 10*3/uL (ref 3.4–10.8)

## 2021-12-16 LAB — PSA: Prostate Specific Ag, Serum: 0.2 ng/mL (ref 0.0–4.0)

## 2021-12-16 LAB — COMPREHENSIVE METABOLIC PANEL
ALT: 31 IU/L (ref 0–44)
AST: 21 IU/L (ref 0–40)
Albumin/Globulin Ratio: 1.5 (ref 1.2–2.2)
Albumin: 4.7 g/dL (ref 4.0–5.0)
Alkaline Phosphatase: 76 IU/L (ref 44–121)
BUN/Creatinine Ratio: 10 (ref 9–20)
BUN: 9 mg/dL (ref 6–24)
Bilirubin Total: 0.3 mg/dL (ref 0.0–1.2)
CO2: 23 mmol/L (ref 20–29)
Calcium: 9.6 mg/dL (ref 8.7–10.2)
Chloride: 103 mmol/L (ref 96–106)
Creatinine, Ser: 0.87 mg/dL (ref 0.76–1.27)
Globulin, Total: 3.1 g/dL (ref 1.5–4.5)
Glucose: 83 mg/dL (ref 70–99)
Potassium: 4.5 mmol/L (ref 3.5–5.2)
Sodium: 141 mmol/L (ref 134–144)
Total Protein: 7.8 g/dL (ref 6.0–8.5)
eGFR: 106 mL/min/{1.73_m2} (ref >59)

## 2021-12-16 NOTE — Assessment & Plan Note (Signed)
Polypharmacy:  Understanding of regimen: poor  Understanding of indications: poor  Potential of compliance: poor  Patient has known adherence challenges. Medication list reviewed and updated as much as possible - see notes on this. This was greatly complicated by the patient not really knowing what he is taking and he reported still having some medications at home that are empty bottles. By his report he is taking his medications much more frequently than prescribed but his pain is still very uncontrolled. Discussed the patient with Dr. Deirdre Priest who recommended patient be seen for close follow up to help start adjusting his medications.   Suggestions: For Neuropathic pain - consider Duloxetine   - consider Gabapentin in place of pregabalin  Pain management  - consider acetaminophen 650mg  SR TID - consider retrial of tramadol in place of oxycodone as the patient reported better control of pain with tramadol - consider use of nsaid as a chronic/prn medication - suggest trial of once daily meloxicam (has tried this in the past)  OR trial of naproxen BID.   I will try to be available for additional discussion on treatment options at his next visit 5/24

## 2021-12-17 LAB — URINE CULTURE

## 2021-12-18 ENCOUNTER — Encounter: Payer: Self-pay | Admitting: Family Medicine

## 2021-12-18 DIAGNOSIS — Z79891 Long term (current) use of opiate analgesic: Secondary | ICD-10-CM | POA: Insufficient documentation

## 2021-12-19 NOTE — Progress Notes (Signed)
Reviewed: I agree with Dr. Koval's documentation and management. 

## 2021-12-21 ENCOUNTER — Ambulatory Visit: Payer: Medicaid Other | Admitting: Family Medicine

## 2022-01-16 ENCOUNTER — Ambulatory Visit (INDEPENDENT_AMBULATORY_CARE_PROVIDER_SITE_OTHER): Payer: Medicaid Other | Admitting: Family Medicine

## 2022-01-16 DIAGNOSIS — G8921 Chronic pain due to trauma: Secondary | ICD-10-CM | POA: Diagnosis present
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
## 2022-01-16 NOTE — Patient Instructions (Addendum)
It was great seeing you today!  Im sorry you are experiencing so much pain. It will be best to call [facility] since they have been prescribing his pain medications and likely have a pain contract in place.  I recommend following up with PCP and bringing all medications in so meds can be adjusted at that time. Appointment is scheduled for 7/18 at 1:30pm.   Feel free to call with any questions or concerns at any time, at [phone].   Take care,  Dr. Cora Collum [facility]
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
## 2022-01-17 NOTE — Assessment & Plan Note (Signed)
Patient presents with ongoing diffuse chronic pain. Requesting oxycodone prescription. Not able to tell me all the pain medications he is currently taking. Was previously receiving opioids from [facility] but daughter states the clinic stopped calling patient back for appointments. PDMP reviewed and both long and short acting oxycodone last prescribed in March. Dr. Valentina Lucks met with patient as well and made some recommendations regarding nerve/pain meds. Did not adjust today. Advised patient to bring all medications to PCP follow up so they can be adjusted. Also advised patient to reach back out to [facility] to get an appointment for his Oxycodone. Family expressed understanding.

## 2022-01-24 ENCOUNTER — Ambulatory Visit (INDEPENDENT_AMBULATORY_CARE_PROVIDER_SITE_OTHER): Payer: Medicaid Other | Admitting: Family Medicine

## 2022-01-24 ENCOUNTER — Ambulatory Visit (HOSPITAL_COMMUNITY): Payer: Medicaid Other | Admitting: Licensed Clinical Social Worker

## 2022-01-24 VITALS — BP 143/91 | HR 79 | Ht 66.0 in | Wt 152.0 lb

## 2022-01-24 DIAGNOSIS — F411 Generalized anxiety disorder: Secondary | ICD-10-CM

## 2022-01-24 DIAGNOSIS — Z87828 Personal history of other (healed) physical injury and trauma: Secondary | ICD-10-CM

## 2022-01-24 DIAGNOSIS — W3400XA Accidental discharge from unspecified firearms or gun, initial encounter: Secondary | ICD-10-CM

## 2022-01-25 ENCOUNTER — Telehealth: Payer: Self-pay | Admitting: Family Medicine

## 2022-01-25 NOTE — Telephone Encounter (Signed)
Called patient's daughter. Informed that paperwork was ready to be picked up. Placed up at front desk.   Veronda Prude, RN

## 2022-01-25 NOTE — Addendum Note (Signed)
Addended by: Manson Passey, Bambi Fehnel on: 01/25/2022 10:52 AM   Modules accepted: Orders

## 2022-01-25 NOTE — Telephone Encounter (Signed)
Q229 completed, copy placed in scan pile. Please call daughter/patient and let them know it is ready for pick up. Reviewed, completed, and signed form.  Note routed to RN team inbasket and placed completed form in RN Wall pocket in the front office.  Westley Chandler, MD

## 2022-01-27 ENCOUNTER — Encounter (HOSPITAL_COMMUNITY): Payer: Medicaid Other

## 2022-01-27 ENCOUNTER — Other Ambulatory Visit (HOSPITAL_COMMUNITY): Payer: Medicaid Other

## 2022-02-02 ENCOUNTER — Ambulatory Visit: Payer: Medicaid Other

## 2022-02-07 ENCOUNTER — Encounter: Payer: Self-pay | Admitting: Physical Medicine and Rehabilitation

## 2022-02-14 ENCOUNTER — Encounter: Payer: Self-pay | Admitting: Family Medicine

## 2022-02-14 ENCOUNTER — Ambulatory Visit (INDEPENDENT_AMBULATORY_CARE_PROVIDER_SITE_OTHER): Payer: Medicaid Other | Admitting: Family Medicine

## 2022-02-14 VITALS — BP 120/93 | HR 128 | Ht 66.0 in | Wt 134.0 lb

## 2022-02-14 DIAGNOSIS — M79605 Pain in left leg: Secondary | ICD-10-CM

## 2022-02-14 DIAGNOSIS — W3400XA Accidental discharge from unspecified firearms or gun, initial encounter: Secondary | ICD-10-CM

## 2022-02-14 DIAGNOSIS — R29898 Other symptoms and signs involving the musculoskeletal system: Secondary | ICD-10-CM

## 2022-02-14 DIAGNOSIS — G8921 Chronic pain due to trauma: Secondary | ICD-10-CM | POA: Diagnosis not present

## 2022-02-14 MED ORDER — PREGABALIN 150 MG PO CAPS: 150.0000 mg | ORAL_CAPSULE | Freq: Three times a day (TID) | ORAL | 3 refills | Status: DC

## 2022-02-14 MED ORDER — BACLOFEN 20 MG PO TABS: 20.0000 mg | ORAL_TABLET | Freq: Two times a day (BID) | ORAL | 3 refills | Status: DC

## 2022-02-14 MED ORDER — ACETAMINOPHEN ER 650 MG PO TBCR: 650.0000 mg | EXTENDED_RELEASE_TABLET | Freq: Three times a day (TID) | ORAL | 3 refills | Status: DC | PRN

## 2022-02-14 NOTE — Patient Instructions (Addendum)
I have translated the following text using Google translate.  As such, there are many errors.  I apologize for the poor written translation; however, we do not have written  translation services yet. It was wonderful to see you today. Thank you for allowing me to be a part of your care. Below is a short summary of what we discussed at your visit today: ??? ??? ?????? ???? ?????? ???????? ????? ????. ??? ??? ????? ? ???? ?????? ?? ???????. ????? ?? ??? ??????? ????????. ??? ??? ? ??? ????? ????? ????? ?????? ??? ????. ??? ?? ?????? ????? ?????. ????? ?? ??? ?????? ?? ??? ???? ????? ?? ??????. ???? ??? ???? ???? ??? ??????? ?? ?????? ?????:  Chronic pain I refilled three pain medicines:  Lyrica: Take 1 capsule by mouth 3 times daily.  This is to treat nerve pain.  Be careful as this can make you sleepy.  If it makes you very sleepy, reduce to taking it 2 times a day.  Baclofen: Take 1 capsule by mouth twice daily.  This is to treat muscle pain.  Be careful as this can make you sleepy.  If it makes you very sleepy, reduce your usage to once a day.  Tylenol: This is a medication you take as needed for pain.  You may take 1 tablet as soon as every 8 hours for pain.  Do not take more than 3 to 4 g of Tylenol in 1 day.  This can damage your liver.  ??? ???? ???? ??? ????? ????? ?????:  ??????: ????? ?????? ????? ?? ???? ???? 3 ???? ??????. ??? ????? ???? ???????. ?? ????? ??? ??? ?? ????? ???? ???????. ??? ???? ??????? ?????? ? ??? ?? ?????? ????? ?? ?????.  ????????: ????? ?????? ????? ?? ???? ???? ????? ??????. ??? ????? ???? ???????. ?? ????? ??? ??? ?? ????? ???? ???????. ??? ???? ??????? ?????? ? ??? ?? ???????? ???? ????? ?? ?????.  ????????: ??? ???? ??????? ??? ?????? ?????. ????? ????? ??? ???? ?? 8 ????? ?????. ?? ???? ???? ?? 3 ??? 4 ???? ?? ???????? ?? ??? ????. ??? ???? ?? ??? ?????.  I referred you to a pain clinic here in [place].  This is call the physical medicine and rehabilitation  clinic.  Their office should be calling you in 1 to 2 weeks with an interpreter to make an appointment.  I have referred you to physical therapy here in [place].  The office should call you within 1 to 2 weeks to make an appointment.  If they do not reach out to you, please give [place] a call so we may check in.  Please bring all of your medications to every appointment!  ??? ??? ??????? ??? ????? ?????? ??? ?? ?????????. ??? ???? ????? ???? ??????? ?????? ???????. ??? ?? ???? ?? ?????? ?? ???? ????? ??? ??????? ?? ????? ?????? ????.  ??? ?????? ??? ?????? ??????? ??? ?? ?????????. ??? ??? ?????? ??????? ?? ?? ???? ????? ??? ??????? ?????? ????. ??? ?? ???????? ??? ? ????? ??????? ??? ??? ????? ?? ????? ??????.  ???? ????? ???? ??????? ?????? ?? ?? ?? ????!  If you have any questions or concerns, please do not hesitate to contact us via phone or MyChart message.  ??? ???? ???? ??? ????? ?? ????????? ? ??? ????? ?? ??????? ??? ??? ?????? ?? ????? MyChart.  Fayette Pho, MD

## 2022-02-16 NOTE — Addendum Note (Signed)
Addended by: Valetta Close on: 02/16/2022 04:52 PM   Modules accepted: Orders

## 2022-02-16 NOTE — Assessment & Plan Note (Signed)
Worsened chronic pain, severely affecting functioning.  Likely due to lack of pain clinic appointments. PDMP reviewed, revealed he has not filled any medications since 5/10.  Last filled pregabalin 5/10, Butrans on 3/13, and Xtampza since 3/09.  He reports he has a [facility] pain clinic appointment in [place] at the end of July.  I will refill his nonopioid medications to get him some relief until he can see [facility] pain clinic.  He is amenable to seeing a PMR clinic here in [place], as he has transportation issues and difficulty getting to the [facility] pain clinic.  He is also amenable to physical therapy. - Refill baclofen 20 mg twice daily - Refill Lyrica 150 mg 3 times daily - Recommend Tylenol 650 mg every 8 - Referral to physical medicine rehabilitation here in [place] - Referral to physical therapy, would likely very much benefit from pool therapy
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
## 2022-02-20 ENCOUNTER — Ambulatory Visit: Payer: Medicaid Other | Admitting: Family Medicine

## 2022-02-27 ENCOUNTER — Ambulatory Visit: Payer: Medicaid Other
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
## 2022-02-28 ENCOUNTER — Ambulatory Visit: Payer: Medicaid Other

## 2022-02-28 ENCOUNTER — Ambulatory Visit (HOSPITAL_COMMUNITY)
Admission: RE | Admit: 2022-02-28 | Discharge: 2022-02-28 | Disposition: A | Discharge status: Home / Self Care | Payer: Medicaid Other | Source: Ambulatory Visit | Attending: Physician Assistant | Admitting: Physician Assistant

## 2022-02-28 ENCOUNTER — Ambulatory Visit (INDEPENDENT_AMBULATORY_CARE_PROVIDER_SITE_OTHER)
Admission: RE | Admit: 2022-02-28 | Discharge: 2022-02-28 | Disposition: A | Discharge status: Home / Self Care | Payer: Medicaid Other | Source: Ambulatory Visit | Attending: Physician Assistant | Admitting: Physician Assistant

## 2022-02-28 DIAGNOSIS — I739 Peripheral vascular disease, unspecified: Secondary | ICD-10-CM | POA: Diagnosis not present

## 2022-02-28 DIAGNOSIS — W3400XA Accidental discharge from unspecified firearms or gun, initial encounter: Secondary | ICD-10-CM

## 2022-02-28 DIAGNOSIS — I82512 Chronic embolism and thrombosis of left femoral vein: Secondary | ICD-10-CM | POA: Diagnosis present

## 2022-02-28 DIAGNOSIS — Y249XXA Unspecified firearm discharge, undetermined intent, initial encounter: Secondary | ICD-10-CM

## 2022-03-02 ENCOUNTER — Ambulatory Visit (INDEPENDENT_AMBULATORY_CARE_PROVIDER_SITE_OTHER): Payer: Medicaid Other | Admitting: Family Medicine

## 2022-03-02 ENCOUNTER — Encounter: Payer: Self-pay | Admitting: Family Medicine

## 2022-03-02 VITALS — BP 132/83 | HR 93 | Ht 66.0 in | Wt 158.6 lb

## 2022-03-02 DIAGNOSIS — R29898 Other symptoms and signs involving the musculoskeletal system: Secondary | ICD-10-CM | POA: Diagnosis not present

## 2022-03-02 DIAGNOSIS — G4700 Insomnia, unspecified: Secondary | ICD-10-CM | POA: Diagnosis present

## 2022-03-02 DIAGNOSIS — W3400XA Accidental discharge from unspecified firearms or gun, initial encounter: Secondary | ICD-10-CM

## 2022-03-02 DIAGNOSIS — G8921 Chronic pain due to trauma: Secondary | ICD-10-CM

## 2022-03-02 DIAGNOSIS — M79605 Pain in left leg: Secondary | ICD-10-CM

## 2022-03-02 MED ORDER — PREGABALIN 150 MG PO CAPS: 300.0000 mg | ORAL_CAPSULE | Freq: Three times a day (TID) | ORAL | 3 refills | Status: DC

## 2022-03-02 MED ORDER — QUETIAPINE FUMARATE 25 MG PO TABS: 25.0000 mg | ORAL_TABLET | Freq: Every day | ORAL | 0 refills | Status: DC

## 2022-03-02 NOTE — Patient Instructions (Addendum)
I have translated the following text using Google translate.  As such, there are many errors.  I apologize for the poor written translation; however, we do not have written  translation services yet. It was wonderful to see you today. Thank you for allowing me to be a part of your care. Below is a short summary of what we discussed at your visit today: ??? ??? ?????? ???? ?????? ???????? ????? ????. ??? ??? ????? ? ???? ?????? ?? ???????. ????? ?? ??? ??????? ????????. ??? ??? ? ??? ????? ????? ????? ?????? ??? ????. ??? ?? ?????? ????? ?????. ????? ?? ??? ?????? ?? ??? ???? ????? ?? ??????. ???? ??? ???? ???? ??? ??????? ?? ?????? ?????:  Pain Increase the pregabalin (Lyrica) to 2 tablets (300 mg total) 3 times daily.  Start Seroquel 25 mg nightly.  If you do not get improved sleep after about a week, you may increase to 2 tablets, 50 mg total at bedtime.  I will call the pain clinic here in [place] to see how much longer it will be to get you an appointment with them. ??? ?? ?????? pregabalin (Lyrica) ??? 2 ??? (300 ??? ???????) 3 ???? ??????.  ???? ?????????? 25 ??? ?? ????. ??? ?? ???? ??? ??? ???? ??? ????? ????? ? ????? ????? ?????? ??? ????? ? ?????? 50 ??? ?? ??? ?????.  ????? ?????? ????? ??? ?? ????????? ?????? ????? ???? ????????? ?????? ??? ???? ????.  Physical and Occupational Therapy You have an appointment with the therapy clinic this Monday, August 7 at 8 AM.  Please see below for more details including the address. ?????? ??????? ??????? ???? ???? ?? ????? ?????? ??? ??????? ??????? 7 ????? ?????? 8 ??????. ???? ??????? ????? ?????? ??? ???? ?? ???????? ??? ?? ??? ???????.  Appointment follow-up with me I will see you on Monday, August 21.  Please see below for more details. ?????? ???????? ??? ?????? ??? ??????? ? 21 ?? (?????). ?????? ??????? ????? ?????? ??? ???? ?? ????????.  Please bring all of your medications to every appointment!  If you have any questions or  concerns, please do not hesitate to contact us via phone or MyChart message.  ???? ????? ???? ??????? ?????? ?? ?? ?? ????!  ??? ???? ???? ?? ????? ?? ????????? ? ??? ????? ?? ??????? ??? ??? ?????? ?? ????? MyChart.  Fayette Pho, MD

## 2022-03-03 ENCOUNTER — Telehealth: Payer: Self-pay

## 2022-03-03 NOTE — Assessment & Plan Note (Signed)
Chronic, uncontrolled.  Affecting daily functioning and quality of life.  Patient reports he was seen by [facility] pain clinic in [facility] a couple weeks ago and was just recently able to pick up his oxycodone from them. I am unable to see [facility] notes on epic or Care Everywhere. PDMP reviewed and incongruent; last controlled med prescribed outside of [facility] was pregabalin filled on 5/10.  Patient has language barrier, his daughter helps him to pick up medications.  It is possible that she just picked up the pregabalin recently and that is what he has been taking. - Increase pregabalin, now 300 mg 3 times daily - Continue baclofen twice daily - For insomnia due to pain, recommend restart of Seroquel 25 mg nightly (currently on med list, patient unsure if taking).  New prescription sent in. - Chart indicates patient has neuro PT/OT evaluations on Monday 8/7 at 8 AM.  Discussed with patient and provided address. - Plan to reach out to PMR clinic and see where this patient is at on the wait list and if I can facilitate scheduling in any way.
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
## 2022-03-03 NOTE — Telephone Encounter (Signed)
A Prior Authorization was initiated for this patients QUETIAPINE 25MG  through CoverMyMeds.   Key: 

## 2022-03-03 NOTE — Telephone Encounter (Signed)
Prior Auth for patients medication QUETIAPINE approved by [employer] thru 03/04/23.  Key: [id number]

## 2022-03-06 ENCOUNTER — Ambulatory Visit: Payer: Medicaid Other | Admitting: Occupational Therapy

## 2022-03-06 ENCOUNTER — Ambulatory Visit: Payer: Medicaid Other | Attending: Family Medicine

## 2022-03-06 DIAGNOSIS — R29898 Other symptoms and signs involving the musculoskeletal system: Secondary | ICD-10-CM | POA: Diagnosis not present

## 2022-03-06 DIAGNOSIS — M25611 Stiffness of right shoulder, not elsewhere classified: Secondary | ICD-10-CM | POA: Insufficient documentation

## 2022-03-06 DIAGNOSIS — R278 Other lack of coordination: Secondary | ICD-10-CM

## 2022-03-06 DIAGNOSIS — M6281 Muscle weakness (generalized): Secondary | ICD-10-CM | POA: Diagnosis present

## 2022-03-06 DIAGNOSIS — M79642 Pain in left hand: Secondary | ICD-10-CM | POA: Diagnosis not present

## 2022-03-06 DIAGNOSIS — R262 Difficulty in walking, not elsewhere classified: Secondary | ICD-10-CM

## 2022-03-06 DIAGNOSIS — R2681 Unsteadiness on feet: Secondary | ICD-10-CM | POA: Diagnosis not present

## 2022-03-06 DIAGNOSIS — R2689 Other abnormalities of gait and mobility: Secondary | ICD-10-CM | POA: Insufficient documentation

## 2022-03-06 DIAGNOSIS — Z87828 Personal history of other (healed) physical injury and trauma: Secondary | ICD-10-CM | POA: Diagnosis not present

## 2022-03-06 DIAGNOSIS — M79641 Pain in right hand: Secondary | ICD-10-CM | POA: Diagnosis not present

## 2022-03-06 DIAGNOSIS — W3400XA Accidental discharge from unspecified firearms or gun, initial encounter: Secondary | ICD-10-CM | POA: Insufficient documentation

## 2022-03-06 DIAGNOSIS — G8929 Other chronic pain: Secondary | ICD-10-CM

## 2022-03-06 DIAGNOSIS — G8921 Chronic pain due to trauma: Secondary | ICD-10-CM | POA: Diagnosis not present

## 2022-03-06 DIAGNOSIS — M79605 Pain in left leg: Secondary | ICD-10-CM | POA: Diagnosis not present

## 2022-03-06 DIAGNOSIS — F4542 Pain disorder with related psychological factors: Secondary | ICD-10-CM | POA: Diagnosis not present

## 2022-03-06 DIAGNOSIS — M25511 Pain in right shoulder: Secondary | ICD-10-CM | POA: Diagnosis not present
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
## 2022-03-07 ENCOUNTER — Ambulatory Visit (HOSPITAL_COMMUNITY): Payer: Medicaid Other | Admitting: Licensed Clinical Social Worker

## 2022-03-14 ENCOUNTER — Ambulatory Visit: Payer: Medicaid Other | Admitting: Physical Therapy

## 2022-03-14 ENCOUNTER — Ambulatory Visit: Payer: Medicaid Other | Admitting: Occupational Therapy

## 2022-03-15 ENCOUNTER — Ambulatory Visit: Payer: Medicaid Other

## 2022-03-16 ENCOUNTER — Ambulatory Visit: Payer: Medicaid Other | Admitting: Physical Therapy

## 2022-03-16 ENCOUNTER — Ambulatory Visit: Payer: Medicaid Other | Admitting: Occupational Therapy

## 2022-03-20 ENCOUNTER — Encounter: Payer: Self-pay | Admitting: Family Medicine

## 2022-03-20 ENCOUNTER — Ambulatory Visit (INDEPENDENT_AMBULATORY_CARE_PROVIDER_SITE_OTHER): Payer: Medicaid Other | Admitting: Family Medicine

## 2022-03-20 VITALS — BP 108/70 | HR 93 | Ht 66.0 in | Wt 159.6 lb

## 2022-03-20 DIAGNOSIS — R413 Other amnesia: Secondary | ICD-10-CM

## 2022-03-20 DIAGNOSIS — F439 Reaction to severe stress, unspecified: Secondary | ICD-10-CM

## 2022-03-20 DIAGNOSIS — Z139 Encounter for screening, unspecified: Secondary | ICD-10-CM | POA: Diagnosis not present

## 2022-03-20 DIAGNOSIS — R29898 Other symptoms and signs involving the musculoskeletal system: Secondary | ICD-10-CM | POA: Diagnosis present

## 2022-03-20 DIAGNOSIS — Z79899 Other long term (current) drug therapy: Secondary | ICD-10-CM

## 2022-03-20 DIAGNOSIS — G8921 Chronic pain due to trauma: Secondary | ICD-10-CM

## 2022-03-20 DIAGNOSIS — Z556 Problems related to health literacy: Secondary | ICD-10-CM

## 2022-03-20 DIAGNOSIS — Z603 Acculturation difficulty: Secondary | ICD-10-CM | POA: Diagnosis not present

## 2022-03-20 DIAGNOSIS — W3400XA Accidental discharge from unspecified firearms or gun, initial encounter: Secondary | ICD-10-CM
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
## 2022-03-21 ENCOUNTER — Ambulatory Visit: Payer: Medicaid Other
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
## 2022-03-22 ENCOUNTER — Ambulatory Visit (INDEPENDENT_AMBULATORY_CARE_PROVIDER_SITE_OTHER): Payer: Medicaid Other | Admitting: Pharmacist

## 2022-03-22 ENCOUNTER — Encounter: Payer: Self-pay | Admitting: Pharmacist

## 2022-03-22 DIAGNOSIS — M79605 Pain in left leg: Secondary | ICD-10-CM | POA: Diagnosis not present

## 2022-03-22 DIAGNOSIS — Z79899 Other long term (current) drug therapy: Secondary | ICD-10-CM

## 2022-03-22 DIAGNOSIS — R29898 Other symptoms and signs involving the musculoskeletal system: Secondary | ICD-10-CM | POA: Diagnosis not present

## 2022-03-22 DIAGNOSIS — G8921 Chronic pain due to trauma: Secondary | ICD-10-CM | POA: Diagnosis not present

## 2022-03-22 DIAGNOSIS — W3400XA Accidental discharge from unspecified firearms or gun, initial encounter: Secondary | ICD-10-CM

## 2022-03-22 MED ORDER — MELOXICAM 7.5 MG PO TABS: 7.5000 mg | ORAL_TABLET | Freq: Every day | ORAL | 11 refills | Status: DC

## 2022-03-22 MED ORDER — PRAVASTATIN SODIUM 10 MG PO TABS: 10.0000 mg | ORAL_TABLET | Freq: Every day | ORAL | 11 refills | Status: DC

## 2022-03-22 MED ORDER — BACLOFEN 20 MG PO TABS: 20.0000 mg | ORAL_TABLET | Freq: Two times a day (BID) | ORAL | 3 refills | Status: DC

## 2022-03-22 MED ORDER — NALOXONE HCL 4 MG/0.1ML NA LIQD: NASAL | 2 refills | Status: AC

## 2022-03-22 MED ORDER — DULOXETINE HCL 60 MG PO CPEP: 60.0000 mg | ORAL_CAPSULE | Freq: Every day | ORAL | 12 refills | Status: DC
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
## 2022-03-22 NOTE — Progress Notes (Signed)
Reviewed: I agree with Dr. Koval's documentation and management. 

## 2022-03-22 NOTE — Assessment & Plan Note (Signed)
Polypharmacy:  Understanding of regimen: fair  Understanding of indications: good  Potential of compliance: good  Patient has known adherence challenges including, language barriers, lack of knowledge, forgetfulness. Medication list reviewed and updated.   The following issues were noted: Current regimen not effectively controlling pain. He has not picked up several of his medications in a long time including duloxetine 60 mg, baclofen 20 mg, pravastatin 10 mg, Xtampza (oxycodone)  ER 36 mg, buprenorphine 5 mcg patches.   Discussed med review with PCP, Dr. Larita Fife.  Sent refills for duloxetine 60 mg, meloxicam 7.5 mg, pravastatin 10 mg, baclofen 20 mg, naloxone nasal spray. Instructed patient to f/u with pain clinic about reflling his Xtampza ER 36 mg, buprenorphine 5 mcg patches and lyrica (pregabalin). Recommended patient inform pain clinic that his current pregabalin dose of 150 mg is causing sedation, and could be adjusted to 75 mg TID to relieve this effect.

## 2022-03-22 NOTE — Patient Instructions (Addendum)
I have translated the following text using Google translate.  As such, there are many errors.  I apologize for the poor written translation; however, we do not have written  translation services yet.  It was great to see you today!   Here are your medications from today: - Lyrica (pregabalin) 150mg : Take 1 tablet twice daily for nerve pain. Take at least once daily at night.  - Cymbalta (duloxetine) 60mg : Take 1 tablet daily for nerve pain - Oxycodone-acetaminophen 10-325: take 1 tablet by mouth three times daily as needed for pain - Xtampza: Take 4-5 tablets daily as needed for pain - Baclofen 20mg : take 1 tablet twice daily for muscle pain - Meloxicam 7.5mg : take 2 tablet once daily for inflammation and joint pain - Seroquel (quetiapine) 25mg : take 1 tablet at night for sleep - Pravastatn 10mg : take 1 tablet daily at bedtime - Diclofenac cream: Use on joints when they feel sore - Naloxone nasal spray - use this medication if you ever have difficulty breathing due to your pain medications. Instruct your family to spray it in your nose if they ever find you in distress or passed out to reverse your pain medications.  Today we sent refills for: Cymbalta (duloxetine), pravastatin, meloxicam Please ask the pain doctor about refills for the following medications: Oxycodone-acetaminophen, xtampza, butrans (buprenorphine) patch and Lyrica  Please ask your pain clinic to adjust the lyrica dose to 75mg  three times daily to help you feel less sleepy while taking it.    ??? ??? ?????? ???? ?????? ???????? ????? ????. ??? ??? ????? ? ???? ?????? ?? ???????. ????? ?? ??? ??????? ????????. ??? ???? ??? ????? ????? ????? ?????? ??? ????.  ???? ?????? ?????!  ?? ?? ??????? ???? ???????? ?? ?????: - ?????? (??????????) 150 ???: ????? ??? ???? ????? ?????? ????? ???? ???????. ?? ??? ????? ??? ????? ?????? ?? ?????. - ???????? (?????????) 60 ???: ????? ????? ?????? ?????? ????? ???? ??????? -  ??????????-???????????? 10-325: ????? ????? ?????? ?? ???? ???? ???? ???? ?????? ??? ?????? ????? ????? - ?????????: ????? 4-5 ????? ?????? ??? ?????? ?????? ????? - ???????? 20 ???: ????? ??? ???? ????? ?????? ????? ???? ??????? - ?????????? 7.5 ???: ????? ????? ??? ????? ?????? ????? ?????????? ????? ??????? - ???????? (????????) 25 ???: ????? ??? ???? ????? ????? - ?????????? 10 ???: ????? ??? ???? ?????? ??? ????? - ???? ??????????: ?????? ??? ??????? ??? ?????? ???? ???? - ???? ?????????? ?????? - ?????? ??? ?????? ??? ??? ????? ?? ????? ?? ?????? ???? ?????? ?????. ???? ?? ?????? ??? ?? ???? ??? ????? ?? ???? ?? ???? ????? ???? ?????? ?????.  ?????? ????? ????? ??: Cymbalta (duloxetine)? pravastatin? meloxicam ???? ???? ???? ????? ?? ????? ??????? ???????: ??????????-????????????? ?????????? ?????? (?????????????) ???????  ???? ?? ???? ?? ????? ????? ?????? ?? ????? ???? ?????? ??? 75 ??? ???? ???? ?????? ???????? ??? ?????? ????? ??? ????? ??????. laqad qumt bitarjamat alnasi altaali biaistikhdam tarjamat jujil. ealaa hadha alnahw , hunak aleadid min al'akhta'i. 'aetadhir ean duef altarjamat alkitabiati. wamae dhalika, lays ladayna khadamat tarjamat maktubat hataa alan. sarirt biruyatik alyawma! ha hi al'adwiat alati tatanawaluha min alyawmi: - lirika (brijabalin) 150 mujama: tanawul qurs wahid maratayn ywmyan lieilaj alam al'aesabi. khudh ealaa al'aqali maratan wahidatan ywmyan fi allayl. - simbalta (dulukistin) 60 mulaghi: tanawal qrsan wahdan ywmyan lieilaj alam al'aesab - 'uwksikudun-asitaminufin 10-325: tanawal qrsan wahdan ean tariq alfam thalath maraat ywmyan hasab alhajat lieilaj al'alam - 'iikistambiza: tanawal 4-5 'aqras ywmyaan hasab alhajat litaskin al'alam - baklufin 20 milaghi:  tanawal qurs wahid maratayn ywmyaan lieilaj alam aleadalat - miluksikam 7.5 milaghi: tanawal qursayn maratan wahidatan ywmyaan lieilaj alailtihabat walam almafasil - sirukwil (kityabin) 25 mulaghi:  tanawal qurs wahid lylaan lilnawm - birafastatn 10 milagh: tanawal qurs wahid ywmyaan eind alnawm - karim diklufinak: yustakhdam ealaa almafasil eind alshueur bi'alam fiha - radhadh alnaaluksun al'anfii - astakhdim hadha aldawa' 'iidha kunt tueani min sueubat fi altanafus bisabab muskinat al'almi. atlub min eayilatik rashah fi 'anfik 'iidha wajaduk fi mihnat 'aw faqadat alwaey lieaks musakinat al'almi. 'arsalna alyawm eubuaat li: Cymbalta (duloxetine)? pravastatin? meloxicam yurjaa sual tabib al'alam ean eubuaat al'adwiat altaaliati: 'uwksikudun-asitaminufin, 'iikistambza, butran (albubrinurfin) walirika yurjaa 'an tatlub min eiadat al'alam alkhasat bik taedil jureat lirika 'iilaa 75 malagh thalath maraat ywmyan limusaeadatik ealaa alshueur binueas 'aqala 'athna' tanawulihi.

## 2022-03-23 ENCOUNTER — Encounter: Payer: Medicaid Other | Admitting: Occupational Therapy

## 2022-03-23 ENCOUNTER — Ambulatory Visit: Payer: Medicaid Other | Admitting: Physical Therapy

## 2022-03-24 ENCOUNTER — Other Ambulatory Visit: Payer: Self-pay

## 2022-03-24 NOTE — Patient Instructions (Signed)
Visit Information  Mr. Stefano Soo  - as a part of your Medicaid benefit, you are eligible for care management and care coordination services at no cost or copay. I was unable to reach you by phone today but would be happy to help you with your health related needs. Please feel free to call me @ [phone].   A member of the Managed Medicaid care management team will reach out to you again over the next 7 days.   Gus Puma, BSW, [place] [facility]  [place]  High Risk Managed Medicaid Team  [phone]

## 2022-03-24 NOTE — Patient Outreach (Signed)
Care Coordination  03/24/2022  [name] [birth year] [id number]   Medicaid Managed Care   Unsuccessful Outreach Note  03/24/2022 Name: Arthur James MRN: [id number] DOB: [birth year]  Referred by: Fayette Pho, MD Reason for referral : High Risk Managed Medicaid (MM Social work Unsuccessful telephone outreach)   An unsuccessful telephone outreach was attempted today. The patient was referred to the case management team for assistance with care management and care coordination.   Follow Up Plan: The care management team will reach out to the patient again over the next 7 days.   Gus Puma, BSW, [place] [facility]  [place]  High Risk Managed Medicaid Team  [phone]

## 2022-03-25 NOTE — Assessment & Plan Note (Signed)
Patient without medications today for full med rec. We have rescheduled him with Dr. Raymondo Band for thorough med rec and adjustment. I will attempt to reach out to his provider at [facility] to coordinate sending meds to local packing pharmacy. PDMP reviewed.
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
## 2022-03-28 ENCOUNTER — Encounter: Payer: Medicaid Other | Admitting: Occupational Therapy

## 2022-03-28 ENCOUNTER — Ambulatory Visit: Payer: Medicaid Other | Admitting: Physical Therapy
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
## 2022-03-30 ENCOUNTER — Encounter: Payer: Medicaid Other | Admitting: Occupational Therapy

## 2022-03-30 ENCOUNTER — Ambulatory Visit (INDEPENDENT_AMBULATORY_CARE_PROVIDER_SITE_OTHER): Payer: Medicaid Other | Admitting: Physician Assistant

## 2022-03-30 ENCOUNTER — Ambulatory Visit: Payer: Medicaid Other | Admitting: Physical Therapy

## 2022-03-30 VITALS — BP 129/83 | HR 74 | Temp 98.1°F | Resp 20 | Ht 66.0 in | Wt 154.7 lb

## 2022-03-30 DIAGNOSIS — I82502 Chronic embolism and thrombosis of unspecified deep veins of left lower extremity: Secondary | ICD-10-CM | POA: Diagnosis not present

## 2022-03-30 DIAGNOSIS — I739 Peripheral vascular disease, unspecified: Secondary | ICD-10-CM | POA: Diagnosis not present

## 2022-04-04 ENCOUNTER — Other Ambulatory Visit: Payer: Self-pay

## 2022-04-04 ENCOUNTER — Ambulatory Visit: Payer: Medicaid Other | Admitting: Physical Therapy

## 2022-04-04 ENCOUNTER — Encounter: Payer: Medicaid Other | Admitting: Occupational Therapy

## 2022-04-04 NOTE — Patient Instructions (Signed)
Visit Information  Mr. Arthur James  - as a part of your Medicaid benefit, you are eligible for care management and care coordination services at no cost or copay. I was unable to reach you by phone today but would be happy to help you with your health related needs. Please feel free to call me @ [phone].   A member of the Managed Medicaid care management team will reach out to you again over the next 7 days.   Shalona Harbour, BSW, [facility]  [place]  High Risk Managed Medicaid Team  [phone]  

## 2022-04-04 NOTE — Patient Outreach (Signed)
Care Coordination  04/04/2022  [name] [birth year] [id number]   Medicaid Managed Care   Unsuccessful Outreach Note  04/04/2022 Name: Arthur James MRN: [id number] DOB: 02/09/1971  Referred by: Fayette Pho, MD Reason for referral : No chief complaint on file.   A second unsuccessful telephone outreach was attempted today. The patient was referred to the case management team for assistance with care management and care coordination.   Follow Up Plan: The care management team will reach out to the patient again over the next 7 days.   Gus Puma, BSW, [place] [facility]  [place]  High Risk Managed Medicaid Team  [phone]

## 2022-04-06 ENCOUNTER — Ambulatory Visit: Payer: Medicaid Other | Admitting: Physical Therapy

## 2022-04-06 ENCOUNTER — Encounter: Payer: Medicaid Other | Admitting: Occupational Therapy

## 2022-04-21 ENCOUNTER — Ambulatory Visit (INDEPENDENT_AMBULATORY_CARE_PROVIDER_SITE_OTHER): Payer: Medicaid Other | Admitting: Family Medicine

## 2022-04-21 ENCOUNTER — Telehealth: Payer: Self-pay | Admitting: Family Medicine

## 2022-04-21 ENCOUNTER — Encounter: Payer: Self-pay | Admitting: Family Medicine

## 2022-04-21 VITALS — BP 129/95 | HR 79 | Ht 66.0 in | Wt 154.8 lb

## 2022-04-21 DIAGNOSIS — G8921 Chronic pain due to trauma: Secondary | ICD-10-CM | POA: Diagnosis not present

## 2022-04-21 DIAGNOSIS — Z603 Acculturation difficulty: Secondary | ICD-10-CM

## 2022-04-21 DIAGNOSIS — Z556 Problems related to health literacy: Secondary | ICD-10-CM | POA: Diagnosis not present
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
## 2022-04-21 NOTE — Telephone Encounter (Addendum)
Spoke with Sharyn Lull at [facility] in [place]. I asked how to best in contact with Mr. Soward pain physician, I believe Dr. Almyra Free from scanned records. I am trying to coordinate care so we send medications to the same [place] and all meds come together in one pill pack.   [address] [place], [place] [id number] Phone: [phone]  Sharyn Lull send chart message to Dr. Vilinda Flake. Provided my personal phone number. Hoping to hear a call back early next week.   Ezequiel Essex, MD

## 2022-04-21 NOTE — Patient Instructions (Addendum)
I have translated the following text using Google translate.  As such, there are many errors.  I apologize for the poor written translation; however, we do not have written  translation services yet. It was wonderful to see you today. Thank you for allowing me to be a part of your care. Below is a short summary of what we discussed at your visit today: ??? ??? ?????? ???? ?????? ???????? ????? ????. ???? ??? ?????? ???? ?????? ?? ???????. ????? ?? ??????? ???????? ??????? ??? ???? ??? ????? ????? ????? ?????? ??? ????. ??? ?? ?????? ????? ?????. ????? ?? ??? ?????? ?? ??? ???? ????? ?? ??????. ????? ??? ???? ???? ??? ??????? ???? ?????? ?????:  Pain I will need to make calls to your different doctors and specialist.  I will start with the physical therapist.  This way I can figure out where the problem was.  Once I have called everybody and heard back, I will give you an update over the phone.  You do not have to come see me for an appointment anytime soon, I will be working on this over the phone from my office.  This way, we can limit the pain you experience from having to come into the clinic.  I will try to send all of your medications to a single pharmacy that will provide the medicines in a pill pack.  I will coordinate with your pain clinic to have all your medicines in one pill pack.  Please bring all of your medications to every appointment! ??? ?????? ??? ????? ??????? ?? ?????? ????????? ?????????. ????? ?? ??????? ???????. ???? ??????? ?????? ????? ??? ???? ???????.  ????? ?? ???? ??????? ?????? ????? ????? ??? ??????? ??? ??????. ??? ???? ?? ???? ?????? ?????? ???? ?? ?? ??? ????? ????? ??? ??? ????? ??? ?????? ?? ?????. ???? ???????? ?????? ???? ?? ????? ???? ????? ??? ????? ???????? ??? ?????? ??? ???????.  ?????? ????? ???? ?????? ??? ?????? ????? ????? ?? ??????? ?? ???? ????. ????? ???????? ?? ????? ????? ?????? ?? ?????? ??? ???? ??????? ?????? ?? ?? ???? ???? ?????.  ???? ?????  ???? ??????? ?????? ?? ?? ?? ????!    Ezequiel Essex, MD

## 2022-04-27 ENCOUNTER — Other Ambulatory Visit: Payer: Self-pay

## 2022-04-27 NOTE — Patient Instructions (Signed)
Visit Information  Mr. Iglesia was given information about Medicaid Managed Care team care coordination services as a part of their [place] Medicaid benefit. Brandyn Schlag verbally consented to engagement with the [facility] Managed Care team.   If you are experiencing a medical emergency, please call 911 or report to your local emergency department or urgent care.   If you have a non-emergency medical problem during routine business hours, please contact your provider's office and ask to speak with a nurse.   For questions related to your [facility], please call: [phone] or visit the homepage here: [email or web address]  If you would like to schedule transportation through your [facility], please call the following number at least 2 days in advance of your appointment: [phone]   Rides for urgent appointments can also be made after hours by calling Member Services.  Call the [place] at [phone], at any time, 24 hours a day, 7 days a week. If you are in danger or need immediate medical attention call 911.  If you would like help to quit smoking, call 1-800-QUIT-NOW 380 640 3013) OR Espaol: 1-855-Djelo-Ya (9-211-941-7408) o para ms informacin haga clic aqu or Text READY to 200-400 to register via text  Mr. Buckalew - following are the goals we discussed in your visit today:   Goals Addressed   None     Social Worker will follow up in 14 days .   Mickel Fuchs, BSW, [place] Managed Medicaid Team  [phone]   Following is a copy of your plan of care:  There are no care plans that you recently modified to display for this patient.
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
## 2022-05-09 ENCOUNTER — Other Ambulatory Visit: Payer: Self-pay | Admitting: Family Medicine

## 2022-05-09 DIAGNOSIS — G4700 Insomnia, unspecified: Secondary | ICD-10-CM

## 2022-05-17 ENCOUNTER — Other Ambulatory Visit: Payer: Self-pay

## 2022-05-17 NOTE — Patient Outreach (Signed)
Care Coordination  05/17/2022  Steffon Schiano 11/22/1970 [id number]   Medicaid Managed Care   Unsuccessful Outreach Note  05/17/2022 Name: Konor Noren MRN: [id number] DOB: Sep 25, 1970  Referred by: Ezequiel Essex, MD Reason for referral : High Risk Managed Medicaid (MM Social work telephone outreach)   An unsuccessful telephone outreach was attempted today. The patient was referred to the case management team for assistance with care management and care coordination.   Follow Up Plan: The care management team will reach out to the patient again over the next 30 days.   Mickel Fuchs, BSW, [place] Managed Medicaid Team  [phone]

## 2022-05-17 NOTE — Patient Instructions (Signed)
Visit Information  Mr. Arthur James  - as a part of your Medicaid benefit, you are eligible for care management and care coordination services at no cost or copay. I was unable to reach you by phone today but would be happy to help you with your health related needs. Please feel free to call me @ [phone]).   A member of the Managed Medicaid care management team will reach out to you again over the next 30 days.   Mickel Fuchs, BSW, [place] Managed Medicaid Team  [phone]

## 2022-06-12 ENCOUNTER — Encounter
Payer: Medicaid Other | Attending: Physical Medicine and Rehabilitation | Admitting: Physical Medicine and Rehabilitation

## 2022-06-12 ENCOUNTER — Encounter: Payer: Self-pay | Admitting: Physical Medicine and Rehabilitation

## 2022-06-12 VITALS — BP 133/86 | HR 82 | Ht 66.0 in | Wt 158.6 lb

## 2022-06-12 DIAGNOSIS — M792 Neuralgia and neuritis, unspecified: Secondary | ICD-10-CM | POA: Diagnosis not present

## 2022-06-12 DIAGNOSIS — M79603 Pain in arm, unspecified: Secondary | ICD-10-CM | POA: Insufficient documentation

## 2022-06-12 DIAGNOSIS — G8921 Chronic pain due to trauma: Secondary | ICD-10-CM | POA: Diagnosis not present

## 2022-06-12 DIAGNOSIS — F32A Depression, unspecified: Secondary | ICD-10-CM | POA: Insufficient documentation

## 2022-06-12 DIAGNOSIS — W3400XA Accidental discharge from unspecified firearms or gun, initial encounter: Secondary | ICD-10-CM | POA: Insufficient documentation

## 2022-06-12 DIAGNOSIS — Z79899 Other long term (current) drug therapy: Secondary | ICD-10-CM | POA: Insufficient documentation

## 2022-06-12 DIAGNOSIS — R531 Weakness: Secondary | ICD-10-CM | POA: Insufficient documentation

## 2022-06-12 DIAGNOSIS — R29898 Other symptoms and signs involving the musculoskeletal system: Secondary | ICD-10-CM | POA: Diagnosis not present

## 2022-06-12 DIAGNOSIS — M25531 Pain in right wrist: Secondary | ICD-10-CM | POA: Diagnosis not present

## 2022-06-12 MED ORDER — LEVETIRACETAM 250 MG PO TABS: 250.0000 mg | ORAL_TABLET | Freq: Two times a day (BID) | ORAL | 5 refills | Status: DC
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
## 2022-06-19 ENCOUNTER — Telehealth: Payer: Self-pay | Admitting: Physical Therapy

## 2022-06-19 ENCOUNTER — Ambulatory Visit: Payer: Medicaid Other | Attending: Physical Medicine and Rehabilitation | Admitting: Physical Therapy

## 2022-06-19 DIAGNOSIS — M792 Neuralgia and neuritis, unspecified: Secondary | ICD-10-CM | POA: Insufficient documentation

## 2022-06-19 DIAGNOSIS — R2681 Unsteadiness on feet: Secondary | ICD-10-CM | POA: Insufficient documentation

## 2022-06-19 DIAGNOSIS — M25552 Pain in left hip: Secondary | ICD-10-CM

## 2022-06-19 DIAGNOSIS — R262 Difficulty in walking, not elsewhere classified: Secondary | ICD-10-CM

## 2022-06-19 DIAGNOSIS — M6281 Muscle weakness (generalized): Secondary | ICD-10-CM | POA: Insufficient documentation

## 2022-06-19 DIAGNOSIS — G8921 Chronic pain due to trauma: Secondary | ICD-10-CM | POA: Insufficient documentation

## 2022-06-19 DIAGNOSIS — M25572 Pain in left ankle and joints of left foot: Secondary | ICD-10-CM

## 2022-06-19 DIAGNOSIS — R29898 Other symptoms and signs involving the musculoskeletal system: Secondary | ICD-10-CM | POA: Insufficient documentation

## 2022-06-19 DIAGNOSIS — G8929 Other chronic pain: Secondary | ICD-10-CM | POA: Insufficient documentation

## 2022-06-19 DIAGNOSIS — R2689 Other abnormalities of gait and mobility: Secondary | ICD-10-CM | POA: Diagnosis not present

## 2022-06-19 DIAGNOSIS — G8250 Quadriplegia, unspecified: Secondary | ICD-10-CM

## 2022-06-19 DIAGNOSIS — W3400XA Accidental discharge from unspecified firearms or gun, initial encounter: Secondary | ICD-10-CM | POA: Diagnosis not present

## 2022-06-19 NOTE — Telephone Encounter (Signed)
Dr. Berline Chough, Mr. Arthur James was evaluated by Physical Therapy on 06/19/22.  The patient would benefit from an Occupational Therapy evaluation for his RUE pain, decreased strength, and decreased ROM.    If you agree, please place an order in [facility] workque in [facility] or fax the order to [phone]. Thank you, Peter Congo, PT, DPT, [facility] [address] [place], [place]  [address] Phone:  [phone] Fax:  [phone]
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
## 2022-06-20 ENCOUNTER — Other Ambulatory Visit: Payer: Self-pay | Admitting: Family Medicine

## 2022-06-20 DIAGNOSIS — W3400XA Accidental discharge from unspecified firearms or gun, initial encounter: Secondary | ICD-10-CM

## 2022-06-20 DIAGNOSIS — R29898 Other symptoms and signs involving the musculoskeletal system: Secondary | ICD-10-CM

## 2022-06-20 DIAGNOSIS — G8921 Chronic pain due to trauma: Secondary | ICD-10-CM

## 2022-06-20 DIAGNOSIS — M79605 Pain in left leg: Secondary | ICD-10-CM

## 2022-06-30 ENCOUNTER — Ambulatory Visit: Payer: Medicaid Other | Attending: Physical Medicine and Rehabilitation | Admitting: Physical Therapy

## 2022-06-30 DIAGNOSIS — G8250 Quadriplegia, unspecified: Secondary | ICD-10-CM | POA: Insufficient documentation

## 2022-06-30 DIAGNOSIS — M6281 Muscle weakness (generalized): Secondary | ICD-10-CM | POA: Diagnosis present

## 2022-06-30 DIAGNOSIS — M25611 Stiffness of right shoulder, not elsewhere classified: Secondary | ICD-10-CM | POA: Insufficient documentation

## 2022-06-30 DIAGNOSIS — G8929 Other chronic pain: Secondary | ICD-10-CM | POA: Diagnosis present

## 2022-06-30 DIAGNOSIS — R208 Other disturbances of skin sensation: Secondary | ICD-10-CM | POA: Insufficient documentation

## 2022-06-30 DIAGNOSIS — M25562 Pain in left knee: Secondary | ICD-10-CM | POA: Diagnosis present

## 2022-06-30 DIAGNOSIS — M25572 Pain in left ankle and joints of left foot: Secondary | ICD-10-CM | POA: Diagnosis present

## 2022-06-30 DIAGNOSIS — M25662 Stiffness of left knee, not elsewhere classified: Secondary | ICD-10-CM | POA: Diagnosis present

## 2022-06-30 DIAGNOSIS — R262 Difficulty in walking, not elsewhere classified: Secondary | ICD-10-CM | POA: Diagnosis present

## 2022-06-30 DIAGNOSIS — R251 Tremor, unspecified: Secondary | ICD-10-CM | POA: Diagnosis present

## 2022-06-30 DIAGNOSIS — R2689 Other abnormalities of gait and mobility: Secondary | ICD-10-CM | POA: Insufficient documentation

## 2022-06-30 DIAGNOSIS — M79632 Pain in left forearm: Secondary | ICD-10-CM | POA: Diagnosis present

## 2022-06-30 DIAGNOSIS — R2681 Unsteadiness on feet: Secondary | ICD-10-CM | POA: Diagnosis present

## 2022-06-30 DIAGNOSIS — M79601 Pain in right arm: Secondary | ICD-10-CM | POA: Diagnosis present

## 2022-06-30 DIAGNOSIS — R278 Other lack of coordination: Secondary | ICD-10-CM | POA: Insufficient documentation

## 2022-06-30 DIAGNOSIS — M25552 Pain in left hip: Secondary | ICD-10-CM | POA: Insufficient documentation
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
## 2022-07-06 ENCOUNTER — Ambulatory Visit: Payer: Medicaid Other

## 2022-07-06 DIAGNOSIS — R2689 Other abnormalities of gait and mobility: Secondary | ICD-10-CM

## 2022-07-06 DIAGNOSIS — M6281 Muscle weakness (generalized): Secondary | ICD-10-CM | POA: Diagnosis not present

## 2022-07-06 DIAGNOSIS — R262 Difficulty in walking, not elsewhere classified: Secondary | ICD-10-CM
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
## 2022-07-12 ENCOUNTER — Ambulatory Visit: Payer: Medicaid Other | Admitting: Occupational Therapy

## 2022-07-14 ENCOUNTER — Telehealth: Payer: Self-pay | Admitting: Physical Therapy

## 2022-07-14 ENCOUNTER — Ambulatory Visit: Payer: Medicaid Other | Admitting: Physical Therapy

## 2022-07-14 ENCOUNTER — Telehealth: Payer: Self-pay | Admitting: Family Medicine

## 2022-07-14 ENCOUNTER — Other Ambulatory Visit: Payer: Self-pay | Admitting: Family Medicine

## 2022-07-14 DIAGNOSIS — G8250 Quadriplegia, unspecified: Secondary | ICD-10-CM

## 2022-07-14 DIAGNOSIS — M25572 Pain in left ankle and joints of left foot: Secondary | ICD-10-CM

## 2022-07-14 DIAGNOSIS — R2689 Other abnormalities of gait and mobility: Secondary | ICD-10-CM

## 2022-07-14 DIAGNOSIS — Z139 Encounter for screening, unspecified: Secondary | ICD-10-CM

## 2022-07-14 DIAGNOSIS — M25662 Stiffness of left knee, not elsewhere classified: Secondary | ICD-10-CM

## 2022-07-14 DIAGNOSIS — F439 Reaction to severe stress, unspecified: Secondary | ICD-10-CM

## 2022-07-14 DIAGNOSIS — R413 Other amnesia: Secondary | ICD-10-CM

## 2022-07-14 DIAGNOSIS — R2681 Unsteadiness on feet: Secondary | ICD-10-CM

## 2022-07-14 DIAGNOSIS — M6281 Muscle weakness (generalized): Secondary | ICD-10-CM | POA: Diagnosis not present

## 2022-07-14 DIAGNOSIS — G8929 Other chronic pain: Secondary | ICD-10-CM

## 2022-07-14 DIAGNOSIS — M25552 Pain in left hip: Secondary | ICD-10-CM

## 2022-07-14 DIAGNOSIS — R262 Difficulty in walking, not elsewhere classified: Secondary | ICD-10-CM

## 2022-07-14 DIAGNOSIS — R4589 Other symptoms and signs involving emotional state: Secondary | ICD-10-CM

## 2022-07-14 DIAGNOSIS — F419 Anxiety disorder, unspecified: Secondary | ICD-10-CM
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
## 2022-07-14 NOTE — Progress Notes (Signed)
Referral to [facility] for counseling.  Fayette Pho, MD

## 2022-07-14 NOTE — Telephone Encounter (Signed)
Dr. Larita Fife,  Mr. Arthur James is being seen by Physical Therapy at [facility]. He would benefit from a referral to a mental health counselor and is finally open to being referred to this specialty. Is this something you are able to assist him with?  Thank you, Peter Congo, PT, DPT, [facility] [address] [place], [place]  [address] Phone:  [phone] Fax:  [phone]

## 2022-07-14 NOTE — Telephone Encounter (Signed)
Spoke with Irven Shelling, LCSW at [facility], regarding this patient's referral to [facility] for counseling.   She currently works with immigrants and refugees who don't qualify for health insurance. Provides free therapy in [place] and [place]. Only available part time, works 10 hours weekly.   She reports that she is the only psychotherapist and there is no other additional counseling services at [facility]. She cannot serve this patient (or his wife) as they are Medicaid eligible.   She recommends [employer] and [employer]. Possibly [facility]. She will text me information with therapists who speak Arabic.   There is the current barrier of no insurance coverage, but we have give them information on applying for Medicaid now that the state has expanded eligibility.   Fayette Pho, MD

## 2022-07-20 ENCOUNTER — Ambulatory Visit: Payer: Medicaid Other | Admitting: Physical Therapy

## 2022-07-20 ENCOUNTER — Ambulatory Visit: Payer: Medicaid Other | Admitting: Occupational Therapy

## 2022-07-20 DIAGNOSIS — R208 Other disturbances of skin sensation: Secondary | ICD-10-CM

## 2022-07-20 DIAGNOSIS — R262 Difficulty in walking, not elsewhere classified: Secondary | ICD-10-CM

## 2022-07-20 DIAGNOSIS — M25572 Pain in left ankle and joints of left foot: Secondary | ICD-10-CM

## 2022-07-20 DIAGNOSIS — G8929 Other chronic pain: Secondary | ICD-10-CM

## 2022-07-20 DIAGNOSIS — M79632 Pain in left forearm: Secondary | ICD-10-CM

## 2022-07-20 DIAGNOSIS — R251 Tremor, unspecified: Secondary | ICD-10-CM

## 2022-07-20 DIAGNOSIS — R278 Other lack of coordination: Secondary | ICD-10-CM

## 2022-07-20 DIAGNOSIS — M79601 Pain in right arm: Secondary | ICD-10-CM

## 2022-07-20 DIAGNOSIS — M25662 Stiffness of left knee, not elsewhere classified: Secondary | ICD-10-CM

## 2022-07-20 DIAGNOSIS — M6281 Muscle weakness (generalized): Secondary | ICD-10-CM

## 2022-07-20 DIAGNOSIS — M25611 Stiffness of right shoulder, not elsewhere classified: Secondary | ICD-10-CM

## 2022-07-20 DIAGNOSIS — R2689 Other abnormalities of gait and mobility: Secondary | ICD-10-CM

## 2022-07-20 DIAGNOSIS — R2681 Unsteadiness on feet: Secondary | ICD-10-CM

## 2022-07-20 DIAGNOSIS — M25552 Pain in left hip: Secondary | ICD-10-CM
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
## 2022-08-02 ENCOUNTER — Ambulatory Visit: Payer: Medicaid Other | Attending: Physical Medicine and Rehabilitation | Admitting: Occupational Therapy

## 2022-08-02 DIAGNOSIS — R208 Other disturbances of skin sensation: Secondary | ICD-10-CM | POA: Diagnosis present

## 2022-08-02 DIAGNOSIS — R251 Tremor, unspecified: Secondary | ICD-10-CM | POA: Insufficient documentation

## 2022-08-02 DIAGNOSIS — M79632 Pain in left forearm: Secondary | ICD-10-CM | POA: Insufficient documentation

## 2022-08-02 DIAGNOSIS — M6281 Muscle weakness (generalized): Secondary | ICD-10-CM | POA: Diagnosis present

## 2022-08-02 DIAGNOSIS — M25611 Stiffness of right shoulder, not elsewhere classified: Secondary | ICD-10-CM | POA: Insufficient documentation

## 2022-08-02 DIAGNOSIS — R278 Other lack of coordination: Secondary | ICD-10-CM | POA: Insufficient documentation

## 2022-08-02 DIAGNOSIS — M79601 Pain in right arm: Secondary | ICD-10-CM | POA: Diagnosis present

## 2022-08-02 NOTE — Patient Instructions (Signed)
SELF ASSISTED WITH OBJECT: Shoulder Flexion / Elbow Extension (Frame)    Keep trunk straight, bend and straighten elbows to move frame backward and forward. ___ reps per set, ___ sets per day, ___ days per week Hold frame with one arm.  Flexion (Passive)    Sitting upright, slide both forearms forward along table, bending from the waist until a stretch is felt. Hold ____ seconds. Repeat ____ times. Do ____ sessions per day.     SITTING: Weight on Forearms    With forearms on table, shift weight from side to side. Hold ___ seconds. ___ reps per set, ___ sets per day, ___ days per week Place wedge under arm.  SITTING: Weight on Hands    Place both hands on sitting surface. Shift weight from side to side. Hold ___ seconds. ___ reps per set, ___ sets per day, ___ days per week Place wedge under hand if wrist range of motion is limited.  SITTING: Reach Across Body    Weight bear on right hand. Reach across body with other hand to reach target. ___ reps per set, ___ sets per day, ___ days per week
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
## 2022-08-08 ENCOUNTER — Encounter: Payer: Self-pay | Admitting: Occupational Therapy

## 2022-08-08 ENCOUNTER — Ambulatory Visit: Payer: Medicaid Other | Admitting: Occupational Therapy

## 2022-08-08 DIAGNOSIS — M79632 Pain in left forearm: Secondary | ICD-10-CM

## 2022-08-08 DIAGNOSIS — M6281 Muscle weakness (generalized): Secondary | ICD-10-CM

## 2022-08-08 DIAGNOSIS — M79601 Pain in right arm: Secondary | ICD-10-CM | POA: Diagnosis not present

## 2022-08-08 DIAGNOSIS — R208 Other disturbances of skin sensation: Secondary | ICD-10-CM

## 2022-08-08 DIAGNOSIS — M25611 Stiffness of right shoulder, not elsewhere classified: Secondary | ICD-10-CM

## 2022-08-08 DIAGNOSIS — R251 Tremor, unspecified: Secondary | ICD-10-CM

## 2022-08-08 DIAGNOSIS — R278 Other lack of coordination: Secondary | ICD-10-CM
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
## 2022-08-09 ENCOUNTER — Other Ambulatory Visit: Payer: Self-pay | Admitting: Family Medicine

## 2022-08-09 DIAGNOSIS — G4700 Insomnia, unspecified: Secondary | ICD-10-CM

## 2022-08-16 ENCOUNTER — Ambulatory Visit: Payer: Medicaid Other | Admitting: Occupational Therapy

## 2022-08-16 ENCOUNTER — Encounter: Payer: Self-pay | Admitting: Occupational Therapy

## 2022-08-16 DIAGNOSIS — M25611 Stiffness of right shoulder, not elsewhere classified: Secondary | ICD-10-CM

## 2022-08-16 DIAGNOSIS — M79601 Pain in right arm: Secondary | ICD-10-CM

## 2022-08-16 DIAGNOSIS — R208 Other disturbances of skin sensation: Secondary | ICD-10-CM

## 2022-08-16 DIAGNOSIS — R278 Other lack of coordination: Secondary | ICD-10-CM

## 2022-08-16 DIAGNOSIS — M6281 Muscle weakness (generalized): Secondary | ICD-10-CM

## 2022-08-16 NOTE — Patient Instructions (Signed)
Flexion (Assistive)    Clasp hands together and raise arms above head, keeping elbows as straight as possible. Repeat __10__ times. Do __2__ sessions per day.  SUPINE: Shoulder Flexion Bilateral (Cane)    Lie on back with knees bent. Hold cane with both hands. Raise both arms to eye level, keep elbows straight, then do circles each way. _5__ reps per set, _2__ sets per day  Cane Horizontal - Supine    With straight arms holding cane above shoulders, bring cane out to right, center, out to left, and back to above head. Repeat _10__ times. Do _2__ times per day.  ELBOW: Flexion - Supine    Rest arm at side. Bend BOTH elbows holding cane. _10__ reps per set, _2__ sets per day
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
## 2022-08-24 ENCOUNTER — Ambulatory Visit: Payer: Medicaid Other | Admitting: Occupational Therapy

## 2022-08-24 ENCOUNTER — Encounter: Payer: Self-pay | Admitting: Occupational Therapy

## 2022-08-24 DIAGNOSIS — M79601 Pain in right arm: Secondary | ICD-10-CM

## 2022-08-24 DIAGNOSIS — M25611 Stiffness of right shoulder, not elsewhere classified: Secondary | ICD-10-CM

## 2022-08-24 DIAGNOSIS — M6281 Muscle weakness (generalized): Secondary | ICD-10-CM

## 2022-08-24 DIAGNOSIS — R278 Other lack of coordination: Secondary | ICD-10-CM

## 2022-08-24 DIAGNOSIS — R208 Other disturbances of skin sensation: Secondary | ICD-10-CM

## 2022-08-24 NOTE — Patient Instructions (Signed)
Grip Strengthening (Resistive Putty)   Squeeze putty using thumb and all fingers. Repeat 15 times. Do 1-2 sessions per day.   Extension (Assistive Putty)   Roll putty back and forth, being sure to use all fingertips. Repeat 3 times. Do 1-2 sessions per day.  Then pinch as below.   Palmar Pinch Strengthening (Resistive Putty)   Pinch putty between thumb and each fingertip in turn after rolling out

## 2022-08-30 ENCOUNTER — Ambulatory Visit: Payer: Medicaid Other | Admitting: Occupational Therapy
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
## 2022-09-07 ENCOUNTER — Ambulatory Visit: Payer: Medicaid Other | Attending: Physical Medicine and Rehabilitation | Admitting: Occupational Therapy

## 2022-09-07 ENCOUNTER — Encounter: Payer: Self-pay | Admitting: Occupational Therapy

## 2022-09-07 DIAGNOSIS — R208 Other disturbances of skin sensation: Secondary | ICD-10-CM | POA: Insufficient documentation

## 2022-09-07 DIAGNOSIS — M79601 Pain in right arm: Secondary | ICD-10-CM | POA: Insufficient documentation

## 2022-09-07 DIAGNOSIS — M79632 Pain in left forearm: Secondary | ICD-10-CM | POA: Diagnosis present

## 2022-09-07 DIAGNOSIS — M25611 Stiffness of right shoulder, not elsewhere classified: Secondary | ICD-10-CM | POA: Diagnosis present

## 2022-09-07 DIAGNOSIS — M6281 Muscle weakness (generalized): Secondary | ICD-10-CM | POA: Diagnosis present

## 2022-09-07 DIAGNOSIS — R251 Tremor, unspecified: Secondary | ICD-10-CM | POA: Diagnosis present

## 2022-09-07 DIAGNOSIS — R278 Other lack of coordination: Secondary | ICD-10-CM | POA: Insufficient documentation

## 2022-09-11 ENCOUNTER — Encounter: Payer: Self-pay | Admitting: Physical Medicine and Rehabilitation

## 2022-09-11 ENCOUNTER — Encounter
Payer: Medicaid Other | Attending: Physical Medicine and Rehabilitation | Admitting: Physical Medicine and Rehabilitation

## 2022-09-11 VITALS — BP 144/84 | HR 76 | Ht 66.0 in | Wt 158.0 lb

## 2022-09-11 DIAGNOSIS — Z79899 Other long term (current) drug therapy: Secondary | ICD-10-CM | POA: Insufficient documentation

## 2022-09-11 DIAGNOSIS — Z5986 Financial insecurity: Secondary | ICD-10-CM | POA: Insufficient documentation

## 2022-09-11 DIAGNOSIS — M79605 Pain in left leg: Secondary | ICD-10-CM

## 2022-09-11 DIAGNOSIS — K5900 Constipation, unspecified: Secondary | ICD-10-CM | POA: Insufficient documentation

## 2022-09-11 DIAGNOSIS — G8222 Paraplegia, incomplete: Secondary | ICD-10-CM | POA: Insufficient documentation

## 2022-09-11 DIAGNOSIS — K5903 Drug induced constipation: Secondary | ICD-10-CM

## 2022-09-11 DIAGNOSIS — R2689 Other abnormalities of gait and mobility: Secondary | ICD-10-CM

## 2022-09-11 DIAGNOSIS — F17210 Nicotine dependence, cigarettes, uncomplicated: Secondary | ICD-10-CM | POA: Diagnosis not present

## 2022-09-11 DIAGNOSIS — Y249XXA Unspecified firearm discharge, undetermined intent, initial encounter: Secondary | ICD-10-CM

## 2022-09-11 DIAGNOSIS — M79604 Pain in right leg: Secondary | ICD-10-CM | POA: Insufficient documentation

## 2022-09-11 DIAGNOSIS — R252 Cramp and spasm: Secondary | ICD-10-CM

## 2022-09-11 DIAGNOSIS — G8929 Other chronic pain: Secondary | ICD-10-CM | POA: Insufficient documentation

## 2022-09-11 DIAGNOSIS — R29898 Other symptoms and signs involving the musculoskeletal system: Secondary | ICD-10-CM

## 2022-09-11 DIAGNOSIS — W3400XA Accidental discharge from unspecified firearms or gun, initial encounter: Secondary | ICD-10-CM | POA: Diagnosis not present

## 2022-09-11 DIAGNOSIS — G8921 Chronic pain due to trauma: Secondary | ICD-10-CM

## 2022-09-11 DIAGNOSIS — G5620 Lesion of ulnar nerve, unspecified upper limb: Secondary | ICD-10-CM | POA: Diagnosis not present

## 2022-09-11 MED ORDER — BACLOFEN 20 MG PO TABS: 20.0000 mg | ORAL_TABLET | Freq: Three times a day (TID) | ORAL | 5 refills | Status: DC
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
## 2022-09-14 ENCOUNTER — Ambulatory Visit: Payer: Medicaid Other | Admitting: Occupational Therapy

## 2022-09-21 ENCOUNTER — Encounter: Payer: Self-pay | Admitting: Occupational Therapy

## 2022-09-21 ENCOUNTER — Ambulatory Visit: Payer: Medicaid Other | Admitting: Occupational Therapy

## 2022-09-21 DIAGNOSIS — M79601 Pain in right arm: Secondary | ICD-10-CM | POA: Diagnosis not present

## 2022-09-21 DIAGNOSIS — M25611 Stiffness of right shoulder, not elsewhere classified: Secondary | ICD-10-CM

## 2022-09-21 DIAGNOSIS — M79632 Pain in left forearm: Secondary | ICD-10-CM

## 2022-09-21 DIAGNOSIS — R278 Other lack of coordination: Secondary | ICD-10-CM
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
## 2022-09-28 ENCOUNTER — Encounter: Payer: Self-pay | Admitting: Occupational Therapy

## 2022-09-28 ENCOUNTER — Ambulatory Visit: Payer: Medicaid Other | Admitting: Occupational Therapy

## 2022-09-28 DIAGNOSIS — M6281 Muscle weakness (generalized): Secondary | ICD-10-CM

## 2022-09-28 DIAGNOSIS — R278 Other lack of coordination: Secondary | ICD-10-CM

## 2022-09-28 DIAGNOSIS — M79632 Pain in left forearm: Secondary | ICD-10-CM

## 2022-09-28 DIAGNOSIS — M79601 Pain in right arm: Secondary | ICD-10-CM

## 2022-09-28 DIAGNOSIS — M25611 Stiffness of right shoulder, not elsewhere classified: Secondary | ICD-10-CM
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
## 2022-10-05 ENCOUNTER — Ambulatory Visit: Payer: Medicaid Other | Attending: Physical Medicine and Rehabilitation | Admitting: Occupational Therapy

## 2022-10-05 ENCOUNTER — Encounter: Payer: Self-pay | Admitting: Occupational Therapy

## 2022-10-05 DIAGNOSIS — M25611 Stiffness of right shoulder, not elsewhere classified: Secondary | ICD-10-CM | POA: Insufficient documentation

## 2022-10-05 DIAGNOSIS — M79601 Pain in right arm: Secondary | ICD-10-CM | POA: Insufficient documentation

## 2022-10-05 DIAGNOSIS — M79632 Pain in left forearm: Secondary | ICD-10-CM | POA: Insufficient documentation

## 2022-10-05 DIAGNOSIS — M6281 Muscle weakness (generalized): Secondary | ICD-10-CM | POA: Insufficient documentation

## 2022-10-05 DIAGNOSIS — R278 Other lack of coordination: Secondary | ICD-10-CM | POA: Insufficient documentation

## 2022-10-05 DIAGNOSIS — R208 Other disturbances of skin sensation: Secondary | ICD-10-CM | POA: Insufficient documentation
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
## 2022-10-12 ENCOUNTER — Encounter: Payer: Self-pay | Admitting: Occupational Therapy

## 2022-10-12 ENCOUNTER — Ambulatory Visit: Payer: Medicaid Other | Admitting: Occupational Therapy

## 2022-10-12 DIAGNOSIS — R278 Other lack of coordination: Secondary | ICD-10-CM | POA: Diagnosis present

## 2022-10-12 DIAGNOSIS — R208 Other disturbances of skin sensation: Secondary | ICD-10-CM

## 2022-10-12 DIAGNOSIS — M79601 Pain in right arm: Secondary | ICD-10-CM

## 2022-10-12 DIAGNOSIS — M6281 Muscle weakness (generalized): Secondary | ICD-10-CM | POA: Diagnosis present

## 2022-10-12 DIAGNOSIS — M25611 Stiffness of right shoulder, not elsewhere classified: Secondary | ICD-10-CM

## 2022-10-12 DIAGNOSIS — M79632 Pain in left forearm: Secondary | ICD-10-CM | POA: Diagnosis present
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
## 2022-10-12 NOTE — Patient Instructions (Addendum)
  1) Help bathe with right hand  2) Eat finger foods with right hand  3) Open lower cabinets and drawers with right hand  4) Pick up cup and drink with right hand  5) Flip over cards right hand  6) Fold towels

## 2022-10-19 ENCOUNTER — Encounter: Payer: Self-pay | Admitting: Occupational Therapy

## 2022-10-19 ENCOUNTER — Ambulatory Visit: Payer: Medicaid Other | Admitting: Occupational Therapy

## 2022-10-19 DIAGNOSIS — M6281 Muscle weakness (generalized): Secondary | ICD-10-CM

## 2022-10-19 DIAGNOSIS — M25611 Stiffness of right shoulder, not elsewhere classified: Secondary | ICD-10-CM

## 2022-10-19 DIAGNOSIS — M79601 Pain in right arm: Secondary | ICD-10-CM

## 2022-10-19 DIAGNOSIS — R208 Other disturbances of skin sensation: Secondary | ICD-10-CM

## 2022-10-19 DIAGNOSIS — R278 Other lack of coordination: Secondary | ICD-10-CM

## 2022-10-19 DIAGNOSIS — M79632 Pain in left forearm: Secondary | ICD-10-CM
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
## 2022-10-26 ENCOUNTER — Encounter: Payer: Medicaid Other | Admitting: Occupational Therapy

## 2022-11-09 ENCOUNTER — Other Ambulatory Visit: Payer: Self-pay

## 2022-11-09 DIAGNOSIS — G4700 Insomnia, unspecified: Secondary | ICD-10-CM

## 2022-11-10 MED ORDER — QUETIAPINE FUMARATE 25 MG PO TABS: ORAL_TABLET | ORAL | 1 refills | Status: AC

## 2022-12-11 ENCOUNTER — Encounter
Payer: Medicaid Other | Attending: Physical Medicine and Rehabilitation | Admitting: Physical Medicine and Rehabilitation

## 2022-12-11 ENCOUNTER — Encounter: Payer: Self-pay | Admitting: Physical Medicine and Rehabilitation

## 2022-12-11 VITALS — BP 131/84 | HR 79 | Ht 66.0 in | Wt 158.0 lb

## 2022-12-11 DIAGNOSIS — W3400XA Accidental discharge from unspecified firearms or gun, initial encounter: Secondary | ICD-10-CM | POA: Insufficient documentation

## 2022-12-11 DIAGNOSIS — M79605 Pain in left leg: Secondary | ICD-10-CM

## 2022-12-11 DIAGNOSIS — R252 Cramp and spasm: Secondary | ICD-10-CM | POA: Diagnosis not present

## 2022-12-11 DIAGNOSIS — F439 Reaction to severe stress, unspecified: Secondary | ICD-10-CM | POA: Insufficient documentation

## 2022-12-11 DIAGNOSIS — R29898 Other symptoms and signs involving the musculoskeletal system: Secondary | ICD-10-CM | POA: Insufficient documentation

## 2022-12-11 DIAGNOSIS — Z79899 Other long term (current) drug therapy: Secondary | ICD-10-CM | POA: Diagnosis not present

## 2022-12-11 DIAGNOSIS — R350 Frequency of micturition: Secondary | ICD-10-CM | POA: Diagnosis not present

## 2022-12-11 DIAGNOSIS — G8921 Chronic pain due to trauma: Secondary | ICD-10-CM | POA: Insufficient documentation

## 2022-12-11 MED ORDER — LEVETIRACETAM 250 MG PO TABS: 250.0000 mg | ORAL_TABLET | Freq: Two times a day (BID) | ORAL | 5 refills | Status: DC

## 2022-12-11 MED ORDER — DULOXETINE HCL 60 MG PO CPEP: 60.0000 mg | ORAL_CAPSULE | Freq: Every day | ORAL | 12 refills | Status: DC

## 2022-12-11 MED ORDER — BACLOFEN 20 MG PO TABS: 20.0000 mg | ORAL_TABLET | Freq: Three times a day (TID) | ORAL | 5 refills | Status: DC

## 2022-12-11 MED ORDER — CITALOPRAM HYDROBROMIDE 20 MG PO TABS: 20.0000 mg | ORAL_TABLET | Freq: Every day | ORAL | 5 refills | Status: DC
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
## 2023-02-06 ENCOUNTER — Other Ambulatory Visit: Payer: Self-pay | Admitting: *Deleted

## 2023-02-06 DIAGNOSIS — I739 Peripheral vascular disease, unspecified: Secondary | ICD-10-CM

## 2023-02-26 ENCOUNTER — Ambulatory Visit (HOSPITAL_COMMUNITY): Payer: Medicaid Other

## 2023-02-26 ENCOUNTER — Ambulatory Visit (INDEPENDENT_AMBULATORY_CARE_PROVIDER_SITE_OTHER): Payer: Medicaid Other | Admitting: Physician Assistant

## 2023-02-26 ENCOUNTER — Ambulatory Visit (HOSPITAL_COMMUNITY)
Admission: RE | Admit: 2023-02-26 | Discharge: 2023-02-26 | Disposition: A | Discharge status: Home / Self Care | Payer: Medicaid Other | Source: Ambulatory Visit | Attending: Surgery | Admitting: Surgery

## 2023-02-26 VITALS — BP 132/87 | HR 74 | Temp 97.9°F | Resp 18 | Ht 66.0 in | Wt 141.5 lb

## 2023-02-26 DIAGNOSIS — M79605 Pain in left leg: Secondary | ICD-10-CM

## 2023-02-26 DIAGNOSIS — I739 Peripheral vascular disease, unspecified: Secondary | ICD-10-CM

## 2023-02-26 DIAGNOSIS — W3400XA Accidental discharge from unspecified firearms or gun, initial encounter: Secondary | ICD-10-CM | POA: Diagnosis not present

## 2023-02-26 LAB — VAS US ABI WITH/WO TBI
Left ABI: 1.3
Right ABI: 1.26
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
## 2023-03-02 ENCOUNTER — Other Ambulatory Visit: Payer: Self-pay

## 2023-03-02 ENCOUNTER — Ambulatory Visit: Payer: Medicaid Other | Attending: Surgery

## 2023-03-02 VITALS — BP 133/84

## 2023-03-02 DIAGNOSIS — R262 Difficulty in walking, not elsewhere classified: Secondary | ICD-10-CM | POA: Insufficient documentation

## 2023-03-02 DIAGNOSIS — M25562 Pain in left knee: Secondary | ICD-10-CM | POA: Insufficient documentation

## 2023-03-02 DIAGNOSIS — M25552 Pain in left hip: Secondary | ICD-10-CM | POA: Insufficient documentation

## 2023-03-02 DIAGNOSIS — M25572 Pain in left ankle and joints of left foot: Secondary | ICD-10-CM | POA: Insufficient documentation

## 2023-03-02 DIAGNOSIS — M6281 Muscle weakness (generalized): Secondary | ICD-10-CM | POA: Insufficient documentation

## 2023-03-02 DIAGNOSIS — G8929 Other chronic pain: Secondary | ICD-10-CM | POA: Insufficient documentation

## 2023-03-02 DIAGNOSIS — I739 Peripheral vascular disease, unspecified: Secondary | ICD-10-CM

## 2023-03-02 DIAGNOSIS — R2689 Other abnormalities of gait and mobility: Secondary | ICD-10-CM | POA: Diagnosis present
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
## 2023-03-09 ENCOUNTER — Encounter: Payer: Self-pay | Admitting: Physical Therapy

## 2023-03-09 ENCOUNTER — Ambulatory Visit: Payer: Medicaid Other | Admitting: Physical Therapy

## 2023-03-09 DIAGNOSIS — M6281 Muscle weakness (generalized): Secondary | ICD-10-CM | POA: Diagnosis not present

## 2023-03-09 DIAGNOSIS — R262 Difficulty in walking, not elsewhere classified: Secondary | ICD-10-CM

## 2023-03-09 DIAGNOSIS — R2689 Other abnormalities of gait and mobility: Secondary | ICD-10-CM
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
## 2023-03-14 ENCOUNTER — Encounter: Payer: Self-pay | Admitting: Physical Medicine and Rehabilitation

## 2023-03-14 ENCOUNTER — Encounter
Payer: Medicaid Other | Attending: Physical Medicine and Rehabilitation | Admitting: Physical Medicine and Rehabilitation

## 2023-03-14 VITALS — BP 122/82 | HR 92 | Ht 66.0 in | Wt 145.2 lb

## 2023-03-14 DIAGNOSIS — G8222 Paraplegia, incomplete: Secondary | ICD-10-CM | POA: Diagnosis not present

## 2023-03-14 DIAGNOSIS — Z603 Acculturation difficulty: Secondary | ICD-10-CM | POA: Diagnosis not present

## 2023-03-14 DIAGNOSIS — R2689 Other abnormalities of gait and mobility: Secondary | ICD-10-CM | POA: Insufficient documentation

## 2023-03-14 DIAGNOSIS — Z79891 Long term (current) use of opiate analgesic: Secondary | ICD-10-CM | POA: Diagnosis not present

## 2023-03-14 DIAGNOSIS — G894 Chronic pain syndrome: Secondary | ICD-10-CM | POA: Diagnosis not present

## 2023-03-14 DIAGNOSIS — Z79899 Other long term (current) drug therapy: Secondary | ICD-10-CM | POA: Diagnosis not present

## 2023-03-14 DIAGNOSIS — X58XXXA Exposure to other specified factors, initial encounter: Secondary | ICD-10-CM | POA: Insufficient documentation

## 2023-03-14 DIAGNOSIS — W3400XA Accidental discharge from unspecified firearms or gun, initial encounter: Secondary | ICD-10-CM | POA: Diagnosis not present

## 2023-03-14 DIAGNOSIS — Z5181 Encounter for therapeutic drug level monitoring: Secondary | ICD-10-CM | POA: Diagnosis not present

## 2023-03-14 DIAGNOSIS — G8921 Chronic pain due to trauma: Secondary | ICD-10-CM | POA: Diagnosis present

## 2023-03-14 DIAGNOSIS — M792 Neuralgia and neuritis, unspecified: Secondary | ICD-10-CM | POA: Diagnosis not present

## 2023-03-14 MED ORDER — BUTRANS 5 MCG/HR TD PTWK: 1.0000 | MEDICATED_PATCH | TRANSDERMAL | 0 refills | Status: DC

## 2023-03-14 MED ORDER — OXYCODONE-ACETAMINOPHEN 10-325 MG PO TABS: 1.0000 | ORAL_TABLET | ORAL | 0 refills | Status: DC | PRN
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
## 2023-03-14 NOTE — Addendum Note (Signed)
Addended by: Doreene Eland on: 03/14/2023 10:23 AM   Modules accepted: Orders
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
## 2023-03-15 ENCOUNTER — Telehealth: Payer: Self-pay | Admitting: *Deleted

## 2023-03-15 ENCOUNTER — Ambulatory Visit: Payer: Medicaid Other

## 2023-03-15 DIAGNOSIS — M25572 Pain in left ankle and joints of left foot: Secondary | ICD-10-CM

## 2023-03-15 DIAGNOSIS — M6281 Muscle weakness (generalized): Secondary | ICD-10-CM

## 2023-03-15 DIAGNOSIS — R2689 Other abnormalities of gait and mobility: Secondary | ICD-10-CM

## 2023-03-15 DIAGNOSIS — R262 Difficulty in walking, not elsewhere classified: Secondary | ICD-10-CM

## 2023-03-15 DIAGNOSIS — G8929 Other chronic pain: Secondary | ICD-10-CM

## 2023-03-15 DIAGNOSIS — M25552 Pain in left hip: Secondary | ICD-10-CM
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
## 2023-03-15 NOTE — Telephone Encounter (Signed)
PA started for [employer].  Arthur James ([facility]asper Lloyd) Rx #: [id number]

## 2023-03-17 LAB — TOXASSURE SELECT,+ANTIDEPR,UR

## 2023-03-19 NOTE — Telephone Encounter (Signed)
Arthur James (Arthur James) - [id number] [employer] 5MCG/HR weekly patches Status: PA Response - Approved
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
## 2023-03-23 ENCOUNTER — Ambulatory Visit: Payer: Medicaid Other

## 2023-03-23 DIAGNOSIS — M25572 Pain in left ankle and joints of left foot: Secondary | ICD-10-CM

## 2023-03-23 DIAGNOSIS — R262 Difficulty in walking, not elsewhere classified: Secondary | ICD-10-CM

## 2023-03-23 DIAGNOSIS — R2689 Other abnormalities of gait and mobility: Secondary | ICD-10-CM

## 2023-03-23 DIAGNOSIS — G8929 Other chronic pain: Secondary | ICD-10-CM

## 2023-03-23 DIAGNOSIS — M6281 Muscle weakness (generalized): Secondary | ICD-10-CM

## 2023-03-23 DIAGNOSIS — M25552 Pain in left hip: Secondary | ICD-10-CM

## 2023-03-30 ENCOUNTER — Encounter: Payer: Self-pay | Admitting: Physical Therapy

## 2023-03-30 ENCOUNTER — Ambulatory Visit: Payer: Medicaid Other | Admitting: Physical Therapy

## 2023-03-30 DIAGNOSIS — R2689 Other abnormalities of gait and mobility: Secondary | ICD-10-CM

## 2023-03-30 DIAGNOSIS — M6281 Muscle weakness (generalized): Secondary | ICD-10-CM | POA: Diagnosis not present

## 2023-03-30 DIAGNOSIS — R262 Difficulty in walking, not elsewhere classified: Secondary | ICD-10-CM
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
## 2023-04-05 ENCOUNTER — Ambulatory Visit: Payer: Medicaid Other | Attending: Surgery

## 2023-04-05 DIAGNOSIS — M25572 Pain in left ankle and joints of left foot: Secondary | ICD-10-CM | POA: Diagnosis present

## 2023-04-05 DIAGNOSIS — R2689 Other abnormalities of gait and mobility: Secondary | ICD-10-CM | POA: Insufficient documentation

## 2023-04-05 DIAGNOSIS — M25552 Pain in left hip: Secondary | ICD-10-CM | POA: Diagnosis present

## 2023-04-05 DIAGNOSIS — M25562 Pain in left knee: Secondary | ICD-10-CM | POA: Diagnosis present

## 2023-04-05 DIAGNOSIS — M6281 Muscle weakness (generalized): Secondary | ICD-10-CM | POA: Diagnosis present

## 2023-04-05 DIAGNOSIS — R262 Difficulty in walking, not elsewhere classified: Secondary | ICD-10-CM | POA: Insufficient documentation

## 2023-04-05 DIAGNOSIS — G8929 Other chronic pain: Secondary | ICD-10-CM | POA: Diagnosis present
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
## 2023-04-13 ENCOUNTER — Ambulatory Visit: Payer: Medicaid Other

## 2023-04-13 DIAGNOSIS — R262 Difficulty in walking, not elsewhere classified: Secondary | ICD-10-CM

## 2023-04-13 DIAGNOSIS — M6281 Muscle weakness (generalized): Secondary | ICD-10-CM

## 2023-04-13 DIAGNOSIS — M25572 Pain in left ankle and joints of left foot: Secondary | ICD-10-CM

## 2023-04-13 DIAGNOSIS — R2689 Other abnormalities of gait and mobility: Secondary | ICD-10-CM

## 2023-04-13 DIAGNOSIS — M25552 Pain in left hip: Secondary | ICD-10-CM

## 2023-04-13 DIAGNOSIS — G8929 Other chronic pain: Secondary | ICD-10-CM
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
## 2023-04-19 ENCOUNTER — Telehealth: Payer: Self-pay | Admitting: Physical Medicine and Rehabilitation

## 2023-04-19 ENCOUNTER — Other Ambulatory Visit: Payer: Self-pay

## 2023-04-19 MED ORDER — BUTRANS 5 MCG/HR TD PTWK: 1.0000 | MEDICATED_PATCH | TRANSDERMAL | 0 refills | Status: DC

## 2023-04-19 MED ORDER — OXYCODONE-ACETAMINOPHEN 10-325 MG PO TABS: 1.0000 | ORAL_TABLET | ORAL | 0 refills | Status: DC | PRN

## 2023-04-19 MED ORDER — PRAVASTATIN SODIUM 10 MG PO TABS: 10.0000 mg | ORAL_TABLET | Freq: Every day | ORAL | 11 refills | Status: AC

## 2023-04-19 NOTE — Telephone Encounter (Signed)
Patient needs refill on oxycodone and patches.  Please call daughter.

## 2023-04-20 ENCOUNTER — Telehealth: Payer: Self-pay | Admitting: *Deleted

## 2023-04-20 ENCOUNTER — Ambulatory Visit: Payer: Medicaid Other

## 2023-04-20 DIAGNOSIS — M25552 Pain in left hip: Secondary | ICD-10-CM

## 2023-04-20 DIAGNOSIS — G8929 Other chronic pain: Secondary | ICD-10-CM

## 2023-04-20 DIAGNOSIS — M6281 Muscle weakness (generalized): Secondary | ICD-10-CM | POA: Diagnosis not present

## 2023-04-20 DIAGNOSIS — M25572 Pain in left ankle and joints of left foot: Secondary | ICD-10-CM

## 2023-04-20 DIAGNOSIS — R2689 Other abnormalities of gait and mobility: Secondary | ICD-10-CM

## 2023-04-20 DIAGNOSIS — R262 Difficulty in walking, not elsewhere classified: Secondary | ICD-10-CM

## 2023-04-20 NOTE — Telephone Encounter (Signed)
Prior auth for oxycodone acetaminophen submitted to insurance via CoverMyMeds.
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
## 2023-04-27 ENCOUNTER — Ambulatory Visit: Payer: Medicaid Other

## 2023-04-27 DIAGNOSIS — M6281 Muscle weakness (generalized): Secondary | ICD-10-CM

## 2023-04-27 DIAGNOSIS — R262 Difficulty in walking, not elsewhere classified: Secondary | ICD-10-CM

## 2023-04-27 DIAGNOSIS — M25552 Pain in left hip: Secondary | ICD-10-CM

## 2023-04-27 DIAGNOSIS — R2689 Other abnormalities of gait and mobility: Secondary | ICD-10-CM

## 2023-04-27 DIAGNOSIS — M25572 Pain in left ankle and joints of left foot: Secondary | ICD-10-CM

## 2023-04-27 DIAGNOSIS — G8929 Other chronic pain: Secondary | ICD-10-CM
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
## 2023-05-03 NOTE — Telephone Encounter (Signed)
Outcome Approved on September 20 by [facility] Medicaid 2017 NCPDP Request Reference Number: [id number]. OXYCOD/APAP TAB 10-325MG  is approved through 10/18/2023. For further questions, call [employer] at [phone]. Authorization Expiration Date: 10/18/2023

## 2023-06-12 ENCOUNTER — Other Ambulatory Visit: Payer: Self-pay | Admitting: Physical Medicine and Rehabilitation

## 2023-06-13 ENCOUNTER — Other Ambulatory Visit: Payer: Self-pay | Admitting: Physical Medicine and Rehabilitation

## 2023-06-13 ENCOUNTER — Encounter
Payer: Medicaid Other | Attending: Physical Medicine and Rehabilitation | Admitting: Physical Medicine and Rehabilitation

## 2023-06-13 ENCOUNTER — Encounter: Payer: Self-pay | Admitting: Physical Medicine and Rehabilitation

## 2023-06-13 DIAGNOSIS — G8921 Chronic pain due to trauma: Secondary | ICD-10-CM | POA: Diagnosis present

## 2023-06-13 DIAGNOSIS — R29898 Other symptoms and signs involving the musculoskeletal system: Secondary | ICD-10-CM | POA: Diagnosis not present

## 2023-06-13 DIAGNOSIS — W3400XA Accidental discharge from unspecified firearms or gun, initial encounter: Secondary | ICD-10-CM | POA: Diagnosis not present

## 2023-06-13 DIAGNOSIS — M79605 Pain in left leg: Secondary | ICD-10-CM | POA: Insufficient documentation

## 2023-06-13 MED ORDER — BUPRENORPHINE 10 MCG/HR TD PTWK: 1.0000 | MEDICATED_PATCH | TRANSDERMAL | 5 refills | Status: DC

## 2023-06-13 MED ORDER — OXYCODONE-ACETAMINOPHEN 10-325 MG PO TABS: 1.0000 | ORAL_TABLET | ORAL | 0 refills | Status: DC | PRN

## 2023-06-13 MED ORDER — OXYCODONE-ACETAMINOPHEN 10-325 MG PO TABS
1.0000 | ORAL_TABLET | ORAL | 0 refills | Status: DC | PRN
Start: 2023-06-13 — End: 2023-09-07

## 2023-06-13 MED ORDER — CITALOPRAM HYDROBROMIDE 20 MG PO TABS: 20.0000 mg | ORAL_TABLET | Freq: Every day | ORAL | 1 refills | Status: DC

## 2023-06-13 MED ORDER — BACLOFEN 20 MG PO TABS
20.0000 mg | ORAL_TABLET | Freq: Three times a day (TID) | ORAL | 1 refills | Status: DC
Start: 2023-06-13 — End: 2023-10-12

## 2023-06-13 MED ORDER — LEVETIRACETAM 1000 MG PO TABS: 1000.0000 mg | ORAL_TABLET | Freq: Two times a day (BID) | ORAL | 1 refills | Status: DC
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
## 2023-06-18 ENCOUNTER — Telehealth: Payer: Self-pay

## 2023-06-18 NOTE — Telephone Encounter (Signed)
PA faxed for Buprenorphine patches

## 2023-07-18 ENCOUNTER — Telehealth: Payer: Self-pay | Admitting: Physical Medicine and Rehabilitation

## 2023-07-18 NOTE — Telephone Encounter (Signed)
Patient's daughter called in and patient is out of Oxycodone and would like a refill sent to [facility] on [address]

## 2023-07-19 NOTE — Telephone Encounter (Signed)
Son notified 

## 2023-08-13 ENCOUNTER — Encounter: Payer: Medicaid Other | Attending: Physical Medicine and Rehabilitation | Admitting: Registered Nurse

## 2023-08-13 VITALS — BP 129/88 | HR 84 | Ht 66.0 in | Wt 163.0 lb

## 2023-08-13 DIAGNOSIS — G8921 Chronic pain due to trauma: Secondary | ICD-10-CM | POA: Diagnosis present

## 2023-08-13 DIAGNOSIS — M25511 Pain in right shoulder: Secondary | ICD-10-CM | POA: Diagnosis not present

## 2023-08-13 DIAGNOSIS — M79605 Pain in left leg: Secondary | ICD-10-CM

## 2023-08-13 DIAGNOSIS — W3400XA Accidental discharge from unspecified firearms or gun, initial encounter: Secondary | ICD-10-CM | POA: Insufficient documentation

## 2023-08-13 DIAGNOSIS — Z5181 Encounter for therapeutic drug level monitoring: Secondary | ICD-10-CM | POA: Diagnosis not present

## 2023-08-13 DIAGNOSIS — Z79891 Long term (current) use of opiate analgesic: Secondary | ICD-10-CM

## 2023-08-13 DIAGNOSIS — G8222 Paraplegia, incomplete: Secondary | ICD-10-CM | POA: Diagnosis not present

## 2023-08-13 DIAGNOSIS — Y249XXA Unspecified firearm discharge, undetermined intent, initial encounter: Secondary | ICD-10-CM

## 2023-08-13 DIAGNOSIS — Z760 Encounter for issue of repeat prescription: Secondary | ICD-10-CM | POA: Diagnosis not present

## 2023-08-13 DIAGNOSIS — G894 Chronic pain syndrome: Secondary | ICD-10-CM

## 2023-08-13 MED ORDER — OXYCODONE-ACETAMINOPHEN 10-325 MG PO TABS: 1.0000 | ORAL_TABLET | ORAL | 0 refills | Status: DC | PRN
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
## 2023-08-16 ENCOUNTER — Telehealth: Payer: Self-pay | Admitting: Registered Nurse

## 2023-08-16 LAB — DRUG TOX MONITOR 1 W/CONF, ORAL FLD
Amphetamines: NEGATIVE ng/mL (ref <10)
Barbiturates: NEGATIVE ng/mL (ref <10)
Benzodiazepines: NEGATIVE ng/mL (ref <0.50)
Buprenorphine: NEGATIVE ng/mL (ref <0.10)
Cocaine: NEGATIVE ng/mL (ref <5.0)
Codeine: NEGATIVE ng/mL (ref <2.5)
Cotinine: 248.1 ng/mL — ABNORMAL HIGH (ref <5.0)
Dihydrocodeine: NEGATIVE ng/mL (ref <2.5)
Fentanyl: NEGATIVE ng/mL (ref <0.10)
Heroin Metabolite: NEGATIVE ng/mL (ref <1.0)
Hydrocodone: NEGATIVE ng/mL (ref <2.5)
Hydromorphone: NEGATIVE ng/mL (ref <2.5)
MARIJUANA: NEGATIVE ng/mL (ref <2.5)
MDMA: NEGATIVE ng/mL (ref <10)
Meprobamate: NEGATIVE ng/mL (ref <2.5)
Methadone: NEGATIVE ng/mL (ref <5.0)
Morphine: NEGATIVE ng/mL (ref <2.5)
Nicotine Metabolite: POSITIVE ng/mL — AB (ref <5.0)
Norhydrocodone: NEGATIVE ng/mL (ref <2.5)
Noroxycodone: 33.2 ng/mL — ABNORMAL HIGH (ref <2.5)
Opiates: POSITIVE ng/mL — AB (ref <2.5)
Oxycodone: >250 ng/mL — ABNORMAL HIGH (ref <2.5)
Oxymorphone: NEGATIVE ng/mL (ref <2.5)
Phencyclidine: NEGATIVE ng/mL (ref <10)
Tapentadol: NEGATIVE ng/mL (ref <5.0)
Tramadol: >500 ng/mL — ABNORMAL HIGH (ref <5.0)
Tramadol: POSITIVE ng/mL — AB (ref <5.0)
Zolpidem: NEGATIVE ng/mL (ref <5.0)

## 2023-08-16 LAB — DRUG TOX ALC METAB W/CON, ORAL FLD: Alcohol Metabolite: NEGATIVE ng/mL (ref <25)

## 2023-08-16 NOTE — Telephone Encounter (Signed)
PMP was Reviewed.  Oral Swab was Reviewed.  Please call patient to see where did he get the Tramadol from,no Tramadol prescription listed on the PMP.  You will need the Language Assit to speak with the patient, he speaks Arabic .

## 2023-08-17 ENCOUNTER — Encounter: Payer: Self-pay | Admitting: Registered Nurse

## 2023-08-21 NOTE — Telephone Encounter (Signed)
Interpreter line used [phone], [place] # [id number]. Patient not available. Message left to call Arthur James at [facility]. Also attempt to call Daughter Arthur James the voicemail was full.

## 2023-09-06 ENCOUNTER — Telehealth: Payer: Self-pay | Admitting: Physical Medicine and Rehabilitation

## 2023-09-06 NOTE — Telephone Encounter (Signed)
 Patients daughter called in requesting medication refill on oxyCODONE -acetaminophen  (PERCOCET) 10-325 MG tablet  , patient would like it sent to pharmacy on file

## 2023-09-07 MED ORDER — OXYCODONE-ACETAMINOPHEN 10-325 MG PO TABS: 1.0000 | ORAL_TABLET | ORAL | 0 refills | Status: DC | PRN

## 2023-09-07 NOTE — Addendum Note (Signed)
 Addended by: Nalu Troublefield on: 09/07/2023 07:08 AM   Modules accepted: Orders

## 2023-09-25 ENCOUNTER — Telehealth: Payer: Self-pay

## 2023-09-25 NOTE — Telephone Encounter (Signed)
(  KeySherre Poot) PA Case ID #: [id number] Submitted for Oxycodone/APAP

## 2023-10-02 NOTE — Telephone Encounter (Signed)
 Outcome Approved on February 25 by [facility] Medicaid 2017 NCPDP Request Reference Number: [id number]. OXYCOD/APAP TAB 10-325MG  is approved through 03/24/2024. For further questions, call [employer] at [phone]. Effective Date: 09/25/2023 Authorization Expiration Date: 03/24/2024

## 2023-10-12 ENCOUNTER — Encounter: Payer: Self-pay | Admitting: Physical Medicine and Rehabilitation

## 2023-10-12 ENCOUNTER — Encounter
Payer: Medicaid Other | Attending: Physical Medicine and Rehabilitation | Admitting: Physical Medicine and Rehabilitation

## 2023-10-12 VITALS — BP 135/84 | HR 93 | Ht 66.0 in | Wt 167.4 lb

## 2023-10-12 DIAGNOSIS — R531 Weakness: Secondary | ICD-10-CM | POA: Insufficient documentation

## 2023-10-12 DIAGNOSIS — W3400XA Accidental discharge from unspecified firearms or gun, initial encounter: Secondary | ICD-10-CM | POA: Insufficient documentation

## 2023-10-12 DIAGNOSIS — M792 Neuralgia and neuritis, unspecified: Secondary | ICD-10-CM | POA: Diagnosis not present

## 2023-10-12 DIAGNOSIS — R29898 Other symptoms and signs involving the musculoskeletal system: Secondary | ICD-10-CM | POA: Diagnosis not present

## 2023-10-12 DIAGNOSIS — F17210 Nicotine dependence, cigarettes, uncomplicated: Secondary | ICD-10-CM | POA: Insufficient documentation

## 2023-10-12 DIAGNOSIS — M79605 Pain in left leg: Secondary | ICD-10-CM | POA: Insufficient documentation

## 2023-10-12 DIAGNOSIS — G8921 Chronic pain due to trauma: Secondary | ICD-10-CM | POA: Insufficient documentation

## 2023-10-12 DIAGNOSIS — Z5986 Financial insecurity: Secondary | ICD-10-CM | POA: Diagnosis not present

## 2023-10-12 DIAGNOSIS — R252 Cramp and spasm: Secondary | ICD-10-CM | POA: Diagnosis present

## 2023-10-12 MED ORDER — BACLOFEN 20 MG PO TABS
20.0000 mg | ORAL_TABLET | Freq: Three times a day (TID) | ORAL | 1 refills | Status: AC
Start: 2023-10-12 — End: ?

## 2023-10-12 MED ORDER — BUPRENORPHINE 10 MCG/HR TD PTWK: 1.0000 | MEDICATED_PATCH | TRANSDERMAL | 5 refills | Status: DC

## 2023-10-12 MED ORDER — CITALOPRAM HYDROBROMIDE 20 MG PO TABS: 20.0000 mg | ORAL_TABLET | Freq: Every day | ORAL | 1 refills | Status: DC

## 2023-10-12 MED ORDER — DULOXETINE HCL 60 MG PO CPEP: 60.0000 mg | ORAL_CAPSULE | Freq: Every day | ORAL | 12 refills | Status: AC

## 2023-10-12 MED ORDER — LEVETIRACETAM 1000 MG PO TABS: 1000.0000 mg | ORAL_TABLET | Freq: Two times a day (BID) | ORAL | 1 refills | Status: DC

## 2023-10-12 MED ORDER — OXYCODONE-ACETAMINOPHEN 10-325 MG PO TABS: 1.0000 | ORAL_TABLET | ORAL | 0 refills | Status: DC | PRN

## 2023-10-12 MED ORDER — LEVETIRACETAM 1000 MG PO TABS: 1000.0000 mg | ORAL_TABLET | Freq: Two times a day (BID) | ORAL | 1 refills | Status: AC
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
## 2023-10-19 ENCOUNTER — Other Ambulatory Visit: Payer: Self-pay | Admitting: Physical Medicine and Rehabilitation

## 2023-11-20 ENCOUNTER — Telehealth: Payer: Self-pay | Admitting: Registered Nurse

## 2023-11-20 MED ORDER — OXYCODONE-ACETAMINOPHEN 10-325 MG PO TABS: 1.0000 | ORAL_TABLET | ORAL | 0 refills | Status: DC | PRN

## 2023-11-20 NOTE — Telephone Encounter (Signed)
 Butran patches need prior auth and patient also needs refill on oxycodone .

## 2023-11-20 NOTE — Telephone Encounter (Signed)
 Dr Raynaldo Call note was reviewed.  PMP was reviewed.  Oxycodone  e-scribed to pharmacy.

## 2023-11-21 ENCOUNTER — Telehealth: Payer: Self-pay

## 2023-11-21 NOTE — Telephone Encounter (Signed)
 PA for Butrans  approved [employer] - 11/20/2023 - 02/19/2024.

## 2023-12-09 ENCOUNTER — Other Ambulatory Visit: Payer: Self-pay | Admitting: Physical Medicine and Rehabilitation

## 2023-12-12 ENCOUNTER — Encounter: Attending: Physical Medicine and Rehabilitation | Admitting: Registered Nurse

## 2023-12-12 ENCOUNTER — Encounter: Payer: Self-pay | Admitting: Registered Nurse

## 2023-12-12 VITALS — BP 157/97 | HR 66 | Ht 66.0 in | Wt 165.8 lb

## 2023-12-12 DIAGNOSIS — R29898 Other symptoms and signs involving the musculoskeletal system: Secondary | ICD-10-CM | POA: Insufficient documentation

## 2023-12-12 DIAGNOSIS — M79605 Pain in left leg: Secondary | ICD-10-CM | POA: Insufficient documentation

## 2023-12-12 DIAGNOSIS — W3400XA Accidental discharge from unspecified firearms or gun, initial encounter: Secondary | ICD-10-CM | POA: Insufficient documentation

## 2023-12-12 DIAGNOSIS — Z79891 Long term (current) use of opiate analgesic: Secondary | ICD-10-CM | POA: Insufficient documentation

## 2023-12-12 DIAGNOSIS — G8222 Paraplegia, incomplete: Secondary | ICD-10-CM | POA: Diagnosis not present

## 2023-12-12 DIAGNOSIS — G894 Chronic pain syndrome: Secondary | ICD-10-CM | POA: Diagnosis not present

## 2023-12-12 DIAGNOSIS — Z5181 Encounter for therapeutic drug level monitoring: Secondary | ICD-10-CM | POA: Insufficient documentation

## 2023-12-12 DIAGNOSIS — G8921 Chronic pain due to trauma: Secondary | ICD-10-CM | POA: Diagnosis not present

## 2023-12-12 DIAGNOSIS — M25511 Pain in right shoulder: Secondary | ICD-10-CM | POA: Diagnosis not present

## 2023-12-12 DIAGNOSIS — M792 Neuralgia and neuritis, unspecified: Secondary | ICD-10-CM | POA: Diagnosis not present

## 2023-12-12 DIAGNOSIS — R252 Cramp and spasm: Secondary | ICD-10-CM | POA: Insufficient documentation

## 2023-12-12 NOTE — Progress Notes (Unsigned)
 Subjective:    Patient ID: Arthur James, male    DOB: October 06, 1970, 53 y.o.   MRN: [id number]  HPI: Arthur James is a 54 y.o. male who returns for follow up appointment for chronic pain and medication refill. states *** pain is located in  ***. rates pain ***. current exercise regime is walking and performing stretching exercises.  Mr. Arley  Morphine equivalent is *** MME.       Pain Inventory Average Pain 10 Pain Right Now 10 My pain is burning, dull, and stabbing  In the last 24 hours, has pain interfered with the following? General activity 10 Relation with others 10 Enjoyment of life 10 What TIME of day is your pain at its worst? morning , daytime, evening, and night Sleep (in general) Fair  Pain is worse with: walking, bending, sitting, standing, and some activites Pain improves with: rest and medication Relief from Meds: 3  No family history on file. Social History   Socioeconomic History   Marital status: Married    Spouse name: Not on file   Number of children: Not on file   Years of education: Not on file   Highest education level: Not on file  Occupational History   Not on file  Tobacco Use   Smoking status: Every Day    Current packs/day: 2.00    Types: Cigarettes    Passive exposure: Never   Smokeless tobacco: Never  Vaping Use   Vaping status: Never Used  Substance and Sexual Activity   Alcohol use: Never   Drug use: Yes    Types: Oxycodone    Sexual activity: Not on file  Other Topics Concern   Not on file  Social History Narrative   ** Merged History Encounter **       Right handed  Caffeine " states a lot throughout the day"   Social Drivers of Health   Financial Resource Strain: High Risk (04/27/2022)   Overall Financial Resource Strain (CARDIA)    Difficulty of Paying Living Expenses: Hard  Food Insecurity: Not on file  Transportation Needs: No Transportation Needs (04/27/2022)   PRAPARE - Transportation    Lack of Transportation  (Medical): No    Lack of Transportation (Non-Medical): No  Physical Activity: Not on file  Stress: Not on file  Social Connections: Unknown (12/12/2021)   Received [facility]   Social Network    Social Network: Not on file   Past Surgical History:  Procedure Laterality Date   APPLICATION OF WOUND VAC Left 12/04/2020   Procedure: APPLICATION OF WOUND VAC;  Surgeon: Richrd Char, MD;  Location: [facility] OR;  Service: Vascular;  Laterality: Left;   FASCIOTOMY Left 12/04/2020   Procedure: Four Compartment FASCIOTOMY;  Surgeon: Richrd Char, MD;  Location: [facility] OR;  Service: Vascular;  Laterality: Left;   FASCIOTOMY CLOSURE Left 12/06/2020   Procedure: FASCIOTOMY LEFT LEG CLOSURE AND WASH OUT;  Surgeon: Young Hensen, MD;  Location: [facility] OR;  Service: Vascular;  Laterality: Left;   FEMORAL ARTERY EXPLORATION Left 12/04/2020   Procedure: FEMORAL ARTERY EXPLORATION, Repair of Left Superficial Femoral Artery using right Saphenous ven.;  Surgeon: Richrd Char, MD;  Location: [facility] OR;  Service: Vascular;  Laterality: Left;   SHOULDER SURGERY Right    Past Surgical History:  Procedure Laterality Date   APPLICATION OF WOUND VAC Left 12/04/2020   Procedure: APPLICATION OF WOUND VAC;  Surgeon: Richrd Char, MD;  Location: [facility] OR;  Service:  Vascular;  Laterality: Left;   FASCIOTOMY Left 12/04/2020   Procedure: Four Compartment FASCIOTOMY;  Surgeon: Richrd Char, MD;  Location: [facility] OR;  Service: Vascular;  Laterality: Left;   FASCIOTOMY CLOSURE Left 12/06/2020   Procedure: FASCIOTOMY LEFT LEG CLOSURE AND WASH OUT;  Surgeon: Young Hensen, MD;  Location: [facility] OR;  Service: Vascular;  Laterality: Left;   FEMORAL ARTERY EXPLORATION Left 12/04/2020   Procedure: FEMORAL ARTERY EXPLORATION, Repair of Left Superficial Femoral Artery using right Saphenous ven.;  Surgeon: Richrd Char, MD;  Location: [facility] OR;  Service: Vascular;  Laterality: Left;   SHOULDER SURGERY Right     Past Medical History:  Diagnosis Date   Neck pain    Supraspinatus tendon tear, left, initial encounter 09/27/2016   Tremor    right hand    BP (!) 157/97 (Patient Position: Sitting, Cuff Size: Normal) Comment: third Blood pressure reading  Pulse 66   Ht 5\' 6"  (1.676 m)   Wt 165 lb 12.8 oz (75.2 kg)   SpO2 93%   BMI 26.76 kg/m   Opioid Risk Score:   Fall Risk Score:  `1  Depression screen [facility] 2/9     12/12/2023    9:51 AM 10/12/2023   10:04 AM 06/13/2023   10:16 AM 03/14/2023    9:29 AM 06/12/2022   10:48 AM 04/21/2022   10:18 AM 03/20/2022   11:06 AM  Depression screen PHQ 2/9  Decreased Interest 1 3 3 3 3 3  0  Down, Depressed, Hopeless 0 3 3 3 3 3  0  PHQ - 2 Score 1 6 6 6 6 6  0  Altered sleeping     3 3 0  Tired, decreased energy     3 3 0  Change in appetite     3 3 0  Feeling bad or failure about yourself      3 2 1   Trouble concentrating     3 3 0  Moving slowly or fidgety/restless     3 3 1   Suicidal thoughts     0 1 2  PHQ-9 Score     24 24 4   Difficult doing work/chores     Extremely dIfficult Somewhat difficult      Review of Systems  Musculoskeletal:  Positive for myalgias.       Left leg pain  Neurological:  Positive for weakness.       Left leg weakness  All other systems reviewed and are negative.      Objective:   Physical Exam        Assessment & Plan:

## 2023-12-12 NOTE — Patient Instructions (Signed)
 Do Not Use The Butrans  Patch anymore:  Skin Irritation and Burning .

## 2023-12-13 ENCOUNTER — Telehealth: Payer: Self-pay | Admitting: Registered Nurse

## 2023-12-13 MED ORDER — BELBUCA 150 MCG BU FILM: 1.0000 | ORAL_FILM | Freq: Two times a day (BID) | BUCCAL | 1 refills | Status: DC

## 2023-12-13 NOTE — Telephone Encounter (Signed)
 Call Placed to LAS line: to place a call to Mr. Demarie regarding his Keppra  instructions and Belbucca. No answer. The LAS interpretor left message.  Discussed with Dr Raynaldo Call Mr. Monu skin reaction with Butrans   burning and skin irritation  she recommended Belbuca . Belbucca was ordered today.

## 2023-12-13 NOTE — Telephone Encounter (Signed)
 Call placed to LAS Line to call Mr. Treyden, Arnesen with Dr Raynaldo Call on 12/12/2023, regarding Mr. Ples complaints of blurred vision and dizziness when he takes the Keppra . Dr Lovorn recommended Keppra  500 mg  at night  or 7 days then discontinued.  His Butrans  was discontinued due to Skin irritation and Burning and Belbucca will be ordered. His interpretor on 12/12/2023, had to leave, prior to translating the above. Mr. Cristoval was aware this provider would give him a call. No answer, will try again.

## 2023-12-16 LAB — DRUG TOX MONITOR 1 W/CONF, ORAL FLD
Alprazolam: NEGATIVE ng/mL (ref <0.50)
Amphetamines: NEGATIVE ng/mL (ref <10)
Barbiturates: NEGATIVE ng/mL (ref <10)
Benzodiazepines: NEGATIVE ng/mL (ref <0.50)
Buprenorphine: NEGATIVE ng/mL (ref <0.10)
Chlordiazepoxide: NEGATIVE ng/mL (ref <0.50)
Clonazepam: NEGATIVE ng/mL (ref <0.50)
Cocaine: NEGATIVE ng/mL (ref <5.0)
Codeine: NEGATIVE ng/mL (ref <2.5)
Cotinine: 230.8 ng/mL — ABNORMAL HIGH (ref <5.0)
Diazepam: NEGATIVE ng/mL (ref <0.50)
Dihydrocodeine: NEGATIVE ng/mL (ref <2.5)
Fentanyl: NEGATIVE ng/mL (ref <0.10)
Flunitrazepam: NEGATIVE ng/mL (ref <0.50)
Flurazepam: NEGATIVE ng/mL (ref <0.50)
Hydrocodone: 38.3 ng/mL — ABNORMAL HIGH (ref <2.5)
Hydromorphone: NEGATIVE ng/mL (ref <2.5)
Lorazepam: NEGATIVE ng/mL (ref <0.50)
MARIJUANA: NEGATIVE ng/mL (ref <2.5)
MDMA: NEGATIVE ng/mL (ref <10)
Meprobamate: NEGATIVE ng/mL (ref <2.5)
Methadone: NEGATIVE ng/mL (ref <5.0)
Midazolam: NEGATIVE ng/mL (ref <0.50)
Morphine: NEGATIVE ng/mL (ref <2.5)
Nicotine Metabolite: POSITIVE ng/mL — AB (ref <5.0)
Nordiazepam: NEGATIVE ng/mL (ref <0.50)
Norhydrocodone: NEGATIVE ng/mL (ref <2.5)
Noroxycodone: 88.1 ng/mL — ABNORMAL HIGH (ref <2.5)
Opiates: POSITIVE ng/mL — AB (ref <2.5)
Oxazepam: NEGATIVE ng/mL (ref <0.50)
Oxycodone: >250 ng/mL — ABNORMAL HIGH (ref <2.5)
Oxymorphone: 9.7 ng/mL — ABNORMAL HIGH (ref <2.5)
Phencyclidine: NEGATIVE ng/mL (ref <10)
Tapentadol: NEGATIVE ng/mL (ref <5.0)
Temazepam: NEGATIVE ng/mL (ref <0.50)
Tramadol: NEGATIVE ng/mL (ref <5.0)
Triazolam: NEGATIVE ng/mL (ref <0.50)
Zolpidem: NEGATIVE ng/mL (ref <5.0)

## 2023-12-16 LAB — DRUG TOX ALC METAB W/CON, ORAL FLD: Alcohol Metabolite: NEGATIVE ng/mL (ref <25)

## 2023-12-19 ENCOUNTER — Telehealth: Payer: Self-pay | Admitting: Registered Nurse

## 2023-12-19 NOTE — Telephone Encounter (Signed)
 Oral Swab results: Inconsistent : + Hydrocodone  and Oxycodone . He is prescribe Oxycodone , call placed to Mr. Arthur James via LAS. We discussed the Keppra , Belbucca and Butrans . He verbalizes understanding.  We discussed the UDS, he reports he is not using Hydrocodone , and hasn't taken any medication from anyone. Then he told the Translator he found an old medication and took one.  The above was discussed with Dr Raynaldo Call.  Sybil RN will mail him a Engineer, manufacturing systems.

## 2023-12-20 NOTE — Telephone Encounter (Signed)
 Formal warning letter sent via MyChart.

## 2023-12-26 ENCOUNTER — Telehealth: Payer: Self-pay

## 2023-12-26 NOTE — Telephone Encounter (Signed)
 PA FOR BELKBUCA 150 MCG FILM submitted to Cover My Meds.

## 2023-12-27 NOTE — Telephone Encounter (Signed)
 Approved on May 28 by [employer] 2017 NCPDP Request Reference Number: [id number]. BELBUCA  MIS is approved through 03/27/2024. For further questions, call [employer] at [phone]. Effective Date: 12/26/2023 Authorization Expiration Date: 03/27/2024

## 2023-12-28 ENCOUNTER — Telehealth: Payer: Self-pay

## 2023-12-28 NOTE — Telephone Encounter (Signed)
 A generic message has been left for Arthur James Rx approval. There was no voice ID. He can call the pharmacy for details or call the office back.  Rx Belbuca   150 mcg film will not be in until Monday. Patient may call around to see if some other pharmacy has it in stock.

## 2024-01-28 ENCOUNTER — Encounter: Admitting: Family Medicine

## 2024-01-29 ENCOUNTER — Ambulatory Visit: Admitting: Family Medicine

## 2024-01-29 ENCOUNTER — Ambulatory Visit: Payer: Self-pay | Admitting: Family Medicine

## 2024-01-29 ENCOUNTER — Encounter: Payer: Self-pay | Admitting: Family Medicine

## 2024-01-29 VITALS — BP 130/94 | HR 96 | Ht 66.0 in | Wt 166.2 lb

## 2024-01-29 DIAGNOSIS — K5903 Drug induced constipation: Secondary | ICD-10-CM | POA: Diagnosis present

## 2024-01-29 DIAGNOSIS — Z131 Encounter for screening for diabetes mellitus: Secondary | ICD-10-CM

## 2024-01-29 DIAGNOSIS — Z87898 Personal history of other specified conditions: Secondary | ICD-10-CM

## 2024-01-29 DIAGNOSIS — G8921 Chronic pain due to trauma: Secondary | ICD-10-CM | POA: Diagnosis not present

## 2024-01-29 LAB — POCT GLYCOSYLATED HEMOGLOBIN (HGB A1C): Hemoglobin A1C: 5.5 % (ref 4.0–5.6)

## 2024-01-29 MED ORDER — SENNA 8.6 MG PO TABS
1.0000 | ORAL_TABLET | Freq: Two times a day (BID) | ORAL | 0 refills | Status: AC
Start: 2024-01-29 — End: ?

## 2024-01-29 MED ORDER — POLYETHYLENE GLYCOL 3350 17 GM/SCOOP PO POWD
17.0000 g | Freq: Two times a day (BID) | ORAL | 1 refills | Status: AC | PRN
Start: 2024-01-29 — End: ?
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
## 2024-01-29 NOTE — Patient Instructions (Signed)
 It was great to see you today! Thank you for choosing [facility] for your primary care.  Today we addressed: 1. Drug-induced constipation Please take Miralax  powder 1 scoop twice a day and senna tablets twice a day.  We can try a next step if this does not help.  IF you experience worsening abdominal pain, please go to the ED.  I will see you in 2 weeks.  Thank you for coming to see us  at [facility] and for the opportunity to care for you! Toma Jamisen Duerson, MD 01/29/2024, 4:28 PM

## 2024-01-29 NOTE — Assessment & Plan Note (Addendum)
 Severe constipation, does have risk factors for ileus and perforation.  Reassuringly, still passing flatus and with small BM earlier today.  No peritonitic signs on exam, but distended abdomen with mild tenderness on palpation.  Will pursue proactive laxative therapy with goal for regular stools ASAP. - MiraLAX  17 g BID, senna 8.6 mg tab BID - Strict ED precautions if worsening abdominal pain - If constipation is not improving, may consider KUB and admission for bowel cleanout, though this would be a more drastic measure if outpatient management fails - Consider Linzess iso chronic opiate use long term

## 2024-02-12 ENCOUNTER — Ambulatory Visit (INDEPENDENT_AMBULATORY_CARE_PROVIDER_SITE_OTHER): Payer: Self-pay | Admitting: Family Medicine

## 2024-02-12 ENCOUNTER — Encounter: Payer: Self-pay | Admitting: Family Medicine

## 2024-02-12 VITALS — BP 136/85 | HR 94 | Ht 66.0 in | Wt 166.4 lb

## 2024-02-12 DIAGNOSIS — K5903 Drug induced constipation: Secondary | ICD-10-CM

## 2024-02-12 DIAGNOSIS — G8921 Chronic pain due to trauma: Secondary | ICD-10-CM

## 2024-02-12 DIAGNOSIS — Z79891 Long term (current) use of opiate analgesic: Secondary | ICD-10-CM | POA: Diagnosis not present

## 2024-02-12 MED ORDER — LACTULOSE 20 GM/30ML PO SOLN
20.0000 g | Freq: Every day | ORAL | 0 refills | Status: AC
Start: 2024-02-12 — End: ?

## 2024-02-12 MED ORDER — LINACLOTIDE 72 MCG PO CAPS: 72.0000 ug | ORAL_CAPSULE | Freq: Every day | ORAL | 1 refills | Status: AC

## 2024-02-12 NOTE — Patient Instructions (Addendum)
????? ?????? ?????! ????? ???????? ????? ??? ???? ??????? ??????? ?????? ???????.  ??????? ????? ?? ???: ???? ????? ?????? ?? ? ?? ?????????? (?? ??) ??????. ????? ????????. ????????? ?? ????? ???? ????? ????? ??????. ???? ??????? ???? ????? ?? ????? ???? ????? ?????? ???? ?-? ????. ????? ????? ???????? ?????? ??????. ???? ?????? ??? ????? ????????. ???? ?????? ??? ???????? ??? ???? ??????? ?? ??? ???? ???? ???? ?? ????? ?? ??? ????? ?????? ??????.   ????? ???????? ????? ??? ???? ??????? ??????? ???? ??????!   Tommi Crepeau, MD 02/12/2024, 2:45 PM _____________________________________  It was great to see you today! Thank you for choosing [facility] for your primary care.  Today we addressed: Please start taking lactulose  20 g (which is 30 mL) daily. Stop the MiraLAX . Continue the Senna pills twice daily. Please do Fleet soap suds enemas twice daily for 3-4 days. I am also starting Linzess  daily. Please return in 1 week for follow up. Please go to the hospital if you start vomiting or if you experience strong abdominal pain or if you notice rectal bleeding.  Thank you for coming to see us  at [facility] and for the opportunity to care for you! Anaiyah Anglemyer, MD 02/12/2024, 2:45 PM

## 2024-02-12 NOTE — Assessment & Plan Note (Signed)
 Not improved from prior visit 7/1 after 2 weeks of senna and MiraLAX  BID, severe constipation at this point.  Patient will need multimodal therapy to break up likely hard stool ball.  No evidence of peritonitis or acute abdomen.  Discussed risk of bowel perforation and importance of seeking emergency care if experiencing red flag symptoms.  Patient elected to proceed with escalation of outpatient bowel regimen rather than ED or inpatient management. - Strict ED precautions if worsening abdominal pain, vomiting/nausea, or rectal bleeding - Replace MiraLAX  with lactulose  20 g (30 mL) daily, continue senna BID - Start daily Fleet soap suds enemas BID for 3-4 days - Start Linzess  72 mcg daily given chronic opiate use - If constipation is not improving, will consider sending to ED for possible admission and bowel cleanout, though this would be a more drastic measure if outpatient management fails

## 2024-02-12 NOTE — Assessment & Plan Note (Signed)
 PM&R follow up tomorrow. - Recommended discussing constipation, other pain regimen concerns at that point
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
## 2024-02-13 ENCOUNTER — Encounter: Payer: Self-pay | Admitting: Physical Medicine and Rehabilitation

## 2024-02-13 ENCOUNTER — Encounter: Attending: Physical Medicine and Rehabilitation | Admitting: Physical Medicine and Rehabilitation

## 2024-02-13 VITALS — BP 128/79 | HR 78 | Ht 66.0 in | Wt 166.0 lb

## 2024-02-13 DIAGNOSIS — Z79899 Other long term (current) drug therapy: Secondary | ICD-10-CM | POA: Diagnosis not present

## 2024-02-13 DIAGNOSIS — W3400XA Accidental discharge from unspecified firearms or gun, initial encounter: Secondary | ICD-10-CM | POA: Diagnosis not present

## 2024-02-13 DIAGNOSIS — G8921 Chronic pain due to trauma: Secondary | ICD-10-CM | POA: Insufficient documentation

## 2024-02-13 DIAGNOSIS — M79604 Pain in right leg: Secondary | ICD-10-CM | POA: Diagnosis not present

## 2024-02-13 DIAGNOSIS — M792 Neuralgia and neuritis, unspecified: Secondary | ICD-10-CM | POA: Insufficient documentation

## 2024-02-13 MED ORDER — TRAMADOL HCL 50 MG PO TABS: 50.0000 mg | ORAL_TABLET | Freq: Three times a day (TID) | ORAL | 5 refills | Status: DC | PRN
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
## 2024-02-18 ENCOUNTER — Ambulatory Visit: Payer: Self-pay | Admitting: Family Medicine
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
## 2024-05-16 ENCOUNTER — Encounter: Payer: Self-pay | Admitting: Physical Medicine and Rehabilitation

## 2024-05-16 ENCOUNTER — Encounter: Attending: Physical Medicine and Rehabilitation | Admitting: Physical Medicine and Rehabilitation

## 2024-05-16 VITALS — BP 127/85 | HR 88 | Ht 66.0 in | Wt 175.0 lb

## 2024-05-16 DIAGNOSIS — G8921 Chronic pain due to trauma: Secondary | ICD-10-CM | POA: Diagnosis not present

## 2024-05-16 DIAGNOSIS — M792 Neuralgia and neuritis, unspecified: Secondary | ICD-10-CM

## 2024-05-16 DIAGNOSIS — G5620 Lesion of ulnar nerve, unspecified upper limb: Secondary | ICD-10-CM | POA: Diagnosis not present

## 2024-05-16 DIAGNOSIS — G588 Other specified mononeuropathies: Secondary | ICD-10-CM | POA: Insufficient documentation

## 2024-05-16 DIAGNOSIS — G8222 Paraplegia, incomplete: Secondary | ICD-10-CM | POA: Insufficient documentation

## 2024-05-16 DIAGNOSIS — M79605 Pain in left leg: Secondary | ICD-10-CM | POA: Insufficient documentation

## 2024-05-16 DIAGNOSIS — R2689 Other abnormalities of gait and mobility: Secondary | ICD-10-CM | POA: Insufficient documentation

## 2024-05-16 DIAGNOSIS — R29898 Other symptoms and signs involving the musculoskeletal system: Secondary | ICD-10-CM | POA: Diagnosis not present

## 2024-05-16 DIAGNOSIS — Y249XXA Unspecified firearm discharge, undetermined intent, initial encounter: Secondary | ICD-10-CM | POA: Diagnosis not present

## 2024-05-16 MED ORDER — TRAMADOL HCL 50 MG PO TABS: 50.0000 mg | ORAL_TABLET | Freq: Four times a day (QID) | ORAL | 5 refills | Status: AC | PRN

## 2024-05-16 MED ORDER — CITALOPRAM HYDROBROMIDE 20 MG PO TABS: 20.0000 mg | ORAL_TABLET | Freq: Every day | ORAL | 1 refills | Status: AC
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
## 2024-08-15 ENCOUNTER — Encounter: Attending: Physical Medicine and Rehabilitation | Admitting: Physical Medicine and Rehabilitation

## 2024-08-15 DIAGNOSIS — G5620 Lesion of ulnar nerve, unspecified upper limb: Secondary | ICD-10-CM | POA: Insufficient documentation

## 2024-08-15 DIAGNOSIS — R2689 Other abnormalities of gait and mobility: Secondary | ICD-10-CM | POA: Insufficient documentation

## 2024-08-15 DIAGNOSIS — G8222 Paraplegia, incomplete: Secondary | ICD-10-CM | POA: Insufficient documentation

## 2024-08-15 DIAGNOSIS — M79605 Pain in left leg: Secondary | ICD-10-CM | POA: Insufficient documentation

## 2024-08-15 DIAGNOSIS — R29898 Other symptoms and signs involving the musculoskeletal system: Secondary | ICD-10-CM | POA: Insufficient documentation

## 2024-08-15 DIAGNOSIS — G588 Other specified mononeuropathies: Secondary | ICD-10-CM | POA: Insufficient documentation

## 2024-08-15 DIAGNOSIS — G8921 Chronic pain due to trauma: Secondary | ICD-10-CM | POA: Insufficient documentation

## 2024-12-08 ENCOUNTER — Encounter: Admitting: Physical Medicine and Rehabilitation
